# Patient Record
Sex: Female | Born: 1935 | Race: White | Hispanic: No | Marital: Married | Smoking: Never smoker
Health system: Southern US, Community
[De-identification: ages and names within clinical notes are randomized; demographics above are authoritative.]

## PROBLEM LIST (undated history)

## (undated) DIAGNOSIS — I119 Hypertensive heart disease without heart failure: Secondary | ICD-10-CM

## (undated) DIAGNOSIS — R011 Cardiac murmur, unspecified: Secondary | ICD-10-CM

## (undated) DIAGNOSIS — N183 Chronic kidney disease, stage 3 unspecified: Secondary | ICD-10-CM

## (undated) DIAGNOSIS — S82009A Unspecified fracture of unspecified patella, initial encounter for closed fracture: Secondary | ICD-10-CM

## (undated) DIAGNOSIS — E785 Hyperlipidemia, unspecified: Secondary | ICD-10-CM

## (undated) DIAGNOSIS — I10 Essential (primary) hypertension: Secondary | ICD-10-CM

## (undated) DIAGNOSIS — I517 Cardiomegaly: Secondary | ICD-10-CM

## (undated) HISTORY — PX: CATARACT EXTRACTION: SUR2

## (undated) HISTORY — DX: Hypertensive heart disease without heart failure: I11.9

## (undated) HISTORY — DX: Hyperlipidemia, unspecified: E78.5

## (undated) HISTORY — DX: Cardiac murmur, unspecified: R01.1

## (undated) HISTORY — DX: Unspecified fracture of unspecified patella, initial encounter for closed fracture: S82.009A

## (undated) HISTORY — DX: Chronic kidney disease, stage 3 (moderate): N18.3

## (undated) HISTORY — DX: Essential (primary) hypertension: I10

## (undated) HISTORY — DX: Chronic kidney disease, stage 3 unspecified: N18.30

## (undated) HISTORY — DX: Cardiomegaly: I51.7

---

## 2001-01-13 ENCOUNTER — Inpatient Hospital Stay (HOSPITAL_COMMUNITY): Admission: EM | Admit: 2001-01-13 | Discharge: 2001-01-17 | Payer: Self-pay | Admitting: Emergency Medicine

## 2001-01-13 ENCOUNTER — Encounter: Payer: Self-pay | Admitting: Emergency Medicine

## 2001-01-14 ENCOUNTER — Encounter: Payer: Self-pay | Admitting: Specialist

## 2001-01-17 ENCOUNTER — Inpatient Hospital Stay (HOSPITAL_COMMUNITY)
Admission: RE | Admit: 2001-01-17 | Discharge: 2001-01-23 | Payer: Self-pay | Admitting: Physical Medicine & Rehabilitation

## 2009-03-26 ENCOUNTER — Emergency Department (HOSPITAL_COMMUNITY): Admission: EM | Admit: 2009-03-26 | Discharge: 2009-03-26 | Payer: Self-pay | Admitting: Emergency Medicine

## 2010-09-16 NOTE — Op Note (Signed)
[place]  Patient:    Regina Hunter, Regina Hunter Visit Number: [id number] MRN: [id number]          Service Type: SUR Location: 5000 [id number] Attending Physician:  Pierce Crane Dictated by:   Javier Docker, M.D. Proc. Date: 01/13/01 Admit Date:  01/13/2001                             Operative Report  PREOPERATIVE DIAGNOSIS:  Patella fracture left, hemarthrosis.  POSTOPERATIVE DIAGNOSIS:  Patella fracture left, hemarthrosis, quadriceps tendon tear.  PROCEDURE:  ORIF of the patella, quadriceps tendon repair.  Evacuation of hematoma.  INDICATION:  This is a 77 year old who fell and displaced patella fracture. Operative intervention was indicated for ORIF.  Risks and benefits were discussed including bleeding, infection, damage to vascular structures, ______ care, arthrofibrosis, etc.  He understood and agreed to planned procedure.  DESCRIPTION OF PROCEDURE:  After administration of general anesthesia 1 gram of Kefzol the right lower extremity was prepped and draped in the usual sterile fashion.  A midline incision was made over the patella.  Hematoma evacuated.  Subcutaneous tissue was dissected.  Electrocautery was utilized to achieve hemostasis.  A transverse fracture through the patella was noted with the inferior half bisected with a T-type fracture.  Three major components were noted.  The patella was everted.  Hematoma evacuated.  Joint was irrigated with some chondromalacia of the femoral sulcus noted.  There was a tendon tear in the quadriceps tendon attaching to the medial lateral retinaculum.  The fracture pins were curetted, irrigated, and repaired with open reduction, internal fixation utilizing cannulated screws and a tension band wire.  First each inferior pole fracture of patellar fragment was then reduced to the cephalad fragment, held with a tenaculum, excellent compression.  Cannulated screw partially threaded was  utilized, measured, excellent purchase obtained.  The second fragment was key-holed in and affixed to the cephalad fragment. Both of the inferior ones interdigitated quite well.  Excellent purchase was obtained.  Decompression of the fracture hematoma was noted.  There was some slight comminution of the patella noted.  Small fragments were excised.  With C-arm augmentation the reduction was excellent.  The cannulated screws were parallel.  Next, a 22 gauge wire was then threaded through the cannulated screws beneath the patellar tendon through the quadriceps tendon and then tightened utilizing needle drivers.  Excellent purchase was noted of the patella and the knee was ranged from 0 to 60 with no displacement of the fracture fragments noted moving in continuity.  The wound was copiously irrigated.  Next, the quadriceps tendon near the area of the retinaculum was repaired with 1 and 2-0 Vicryl simple sutures with excellent repair noted.  The peritenon was reapproximated with 2-0 Vicryl simple sutures.  There was some slight loss of the periosteum over the anterior aspect of the patella.  In the subcutaneous tissue was noted to be a fairly large pocket due to the hematoma.  This area was irrigated.  It appeared viable however.  The subcutaneous tissue was reapproximated with 2-0 Vicryl simple sutures.  Skin was reapproximated with staples and the wound was dressed sterilely and secured with an Ace bandage.  The patient was then awakened without difficulty and transported to recovery room in satisfactory condition.  The patient tolerated the procedure well without complications.  -1 Dictated by:   Javier Docker, M.D. Attending Physician:  Pierce Crane DD:  01/14/01 TD:  01/14/01 Job: [id number]

## 2010-09-16 NOTE — Discharge Summary (Signed)
Regina Hunter. [facility]  Patient:    Regina Hunter, Regina Hunter Visit Number: [id number] MRN: [id number]          Service Type: [facility] Location: 4100 [id number] Attending Physician:  Regina Hunter T Dictated by:   Regina Situ, PA Admit Date:  01/17/2001 Discharge Date: 01/23/2001   CC:         Regina Hunter, M.D.             Regina Hunter, M.D.                           Discharge Summary  DISCHARGE DIAGNOSES: 1. Status post open reduction and internal fixation right patella fracture. 2. Postoperative anemia. 3. Postoperative muscle spasm.  HISTORY OF PRESENT ILLNESS:  Regina Hunter is a 77 year old female in good health who fell at church on January 13, 2001, sustained right displaced patella fracture with effusion.  She was evaluated by Regina Hunter, M.D., and underwent ORIF of the right patella with _________ of tendinous repair and evacuation of hematoma on January 14, 2001.  Postop, is partial weightbearing on right lower extremity with knee immobilizer on right lower extremity at all times to prevent range of motion.  She was started on subcu Lovenox on January 16, 2001, for DVT prophylaxis.  Postop, UA was checked and was negative with urine culture pending.  She has been participating in therapies.  Min to mod assist for transfers, min assist ambulating 60 feet with standard walker.  PAST MEDICAL HISTORY:  Negative for surgeries or major medical illness.  ALLERGIES:  No known drug allergies.  SOCIAL HISTORY:  [place] home here that is one level with three steps at entry.  She was independent prior to admission. She travels weekly to assist her 50 year old mother here. She does not use any tobacco or alcohol.  HOSPITAL COURSE:  Ms. Regina Hunter was admitted to rehab on January 17, 2001, for inpatient therapies to consist of PT and OT daily. Past admission, she  was maintained on partial weightbearing status of right lower extremity with knee immobilizer to prevent range of motion.  She was maintained on subcu Lovenox of DVT prophylaxis.  Bilateral lower extremity Duplex were done past admission showing no signs of DVT.  Labs past admission showed H&H at 10.4 and 20.5.  Electrolytes showed sodium 140, potassium 3.7 and chloride 104, CO2 28, BUN 10, creatinine 0.7, glucose 117, total protein 5.8, albumin 2.5, AST 24, ALT 27, alkaline phosphatase 71. Her knee incision was noted to be healing well without any signs or symptoms of infection or drainage.  No erythema noted.  Staples were discontinued and area steri-stripped on postop day #9 prior to discharge.  Pain control has been reasonable with Skelaxin being used p.r.n. for lower extremity spasms. The patient progressed along well and during her therapies she progressed to being at modified independent for ADL needs.  She was modified independent for transfers, modified independent for ambulating 100 to 170 feet.  Appropriate follow-up therapies to include home health PT and OT to advanced per [facility] past discharge.  On January 23, 2001, the patient was discharged to home.  DISCHARGE MEDICATIONS: 1. Vitamin C 500 mg per day. 2. Trinsicon one p.o. b.i.d. x 1 month. 3. Oxycodone 5 to 10 mg p.o. q.4-6h. p.r.n. pain. 4. Skelaxin 400 mg q.i.d. p.r.n. spasms. 5. Enteric coated aspirin 325  mg b.i.d. for two weeks.  ACTIVITY:  Use walker, wear immobilizer on right knee at all times.  Do not bend right knee.  SPECIAL INSTRUCTIONS:  No alcohol, no smoking, no driving.  FOLLOW-UP:  The patient is to follow up with Dr. Shelle Iron in seven to 10 days for postop check, follow up with Regina Hunter, M.D., as needed. Dictated by: Regina Situ, PA Attending Physician:  Regina Hunter T DD:  03/05/01 TD:  03/07/01 Job: [id number]

## 2010-09-16 NOTE — Discharge Summary (Signed)
[place]  Patient:    Regina Hunter, Regina Hunter Visit Number: [id number] MRN: [id number]          Service Type: [facility] Location: 4100 [id number] Attending Physician:  Faith Rogue T Dictated by:   Ralene Bathe, P.A.C. Admit Date:  01/17/2001 Discharge Date: 01/23/2001                             Discharge Summary  ADMISSION DIAGNOSES: 1. Patella fracture with displacement, right. 2. Hypokalemia.  DISCHARGE DIAGNOSES: 1. Patella fracture with displacement, right. 2. Hypokalemia. 3. Status post open reduction and internal fixation of right patella.  OPERATIONS:  Open reduction and internal fixation right patella.  SURGEON:  Javier Docker, M.D.  ASSISTANTJill Side P. Mahar, P.A.C.  ANESTHESIA:  General anesthesia.  BRIEF HISTORY:  This is a 77 year old female who fell the morning of admission at church, onto her right knee.  She sustained a displaced right patella fracture. She was felt to be stable to proceed with operative fixation the following day.  She was noted to be hypokalemic on admission which was supplemented.  HOSPITAL COURSE:  The patient was admitted and underwent the above named procedure and tolerated this well.  All appropriate IV antibiotics and analgesics were utilized.  Postoperatively, she was placed in a knee immobilizer.  Her urine was checked for culture and sensitivity which was negative.  She was placed on aspirin b.i.d. for DVT and PE prophylaxis.  She was allowed to partial weightbear to the operative extremity.  She was felt to require rehab consult for possible inpatient stay due to her size and age and to improve mobility.  On date, January 17, 2001, her UA was negative.  She was improving slowly.  She was neurovascularly intact.  Dressing was clean and dry.  Incision dry.  She was felt stable for transfer to rehab as they felt the patient was appropriate for rehab level therapies.  LABORATORY DATA:   Hemoglobin on admission 14.3, postoperatively was 11.8. Chemistry on admission showed potassium 3.2, postoperatively was 4.5. Protimes normal.  Urinalysis normal.  EKG showed normal sinus rhythm.  X-rays showed a patella fracture displaced and knee effusion on admission on January 13, 2001.  C-arm showed fixation.  CONDITION ON DISCHARGE:  Stable and improved.  DISCHARGE MEDICATIONS AND PLANS:  The patient being discharged to rehabilitation unit.  We will continue to follow her there. Daily dressing changes.  Continue up ad lib with therapies.  DIET:  Regular.  DISCHARGE MEDICATIONS:  Continue current medications.  ACTIVITY:  She is partial weightbearing 50% on upper extremity. Dictated by:   Ralene Bathe, P.A.C. Attending Physician:  Faith Rogue T DD:  02/19/99 TD:  02/19/01 Job: [id number]

## 2011-07-25 ENCOUNTER — Telehealth: Payer: Self-pay | Admitting: Cardiology

## 2011-07-25 NOTE — Telephone Encounter (Signed)
Pt had surgery on her eye and was told her b/p is dangerously high and needs to be checked and she was wondering if Dr. Patty Sermons will see her if not will he refer her to someone

## 2011-07-25 NOTE — Telephone Encounter (Signed)
Okay to take her as a new patient.  Schedule at the next available slot

## 2011-07-25 NOTE — Telephone Encounter (Signed)
This is Regina Hunter's wife.  Will forward to  Dr. Patty Sermons to see if he will take her as a new patient

## 2011-07-27 NOTE — Telephone Encounter (Signed)
Offered patient an appointment today but she is unable to come.  Did put her in for next week.  Patient does not monitor at home, but can.  Advised to monitor and if remains elevated to go to Urgent Care to be evaluated.  Patient states she does not have a PCP, blood pressure elevated after a procedure

## 2011-07-28 ENCOUNTER — Ambulatory Visit (INDEPENDENT_AMBULATORY_CARE_PROVIDER_SITE_OTHER): Payer: Medicare Other | Admitting: Family Medicine

## 2011-07-28 VITALS — BP 235/110 | HR 94 | Temp 97.8°F | Resp 16 | Ht 61.5 in | Wt 173.2 lb

## 2011-07-28 DIAGNOSIS — I10 Essential (primary) hypertension: Secondary | ICD-10-CM

## 2011-07-28 LAB — POCT CBC
Granulocyte percent: 73.3 %G (ref 37–80)
HCT, POC: 44.4 % (ref 37.7–47.9)
Hemoglobin: 14.4 g/dL (ref 12.2–16.2)
Lymph, poc: 2.3 (ref 0.6–3.4)
MCH, POC: 28.8 pg (ref 27–31.2)
MCHC: 32.4 g/dL (ref 31.8–35.4)
MCV: 88.8 fL (ref 80–97)
MID (cbc): 0.4 (ref 0–0.9)
MPV: 9.2 fL (ref 0–99.8)
POC Granulocyte: 7.3 — AB (ref 2–6.9)
POC LYMPH PERCENT: 22.8 %L (ref 10–50)
POC MID %: 3.9 %M (ref 0–12)
Platelet Count, POC: 294 10*3/uL (ref 142–424)
RBC: 5 M/uL (ref 4.04–5.48)
RDW, POC: 15 %
WBC: 10 10*3/uL (ref 4.6–10.2)

## 2011-07-28 LAB — COMPREHENSIVE METABOLIC PANEL
ALT: 14 U/L (ref 0–35)
AST: 18 U/L (ref 0–37)
Albumin: 4.8 g/dL (ref 3.5–5.2)
Alkaline Phosphatase: 77 U/L (ref 39–117)
BUN: 23 mg/dL (ref 6–23)
CO2: 27 mEq/L (ref 19–32)
Calcium: 9.8 mg/dL (ref 8.4–10.5)
Chloride: 102 mEq/L (ref 96–112)
Creat: 0.89 mg/dL (ref 0.50–1.10)
Glucose, Bld: 109 mg/dL — ABNORMAL HIGH (ref 70–99)
Potassium: 4.3 mEq/L (ref 3.5–5.3)
Sodium: 141 mEq/L (ref 135–145)
Total Bilirubin: 0.7 mg/dL (ref 0.3–1.2)
Total Protein: 7.4 g/dL (ref 6.0–8.3)

## 2011-07-28 LAB — LIPID PANEL
Cholesterol: 248 mg/dL — ABNORMAL HIGH (ref 0–200)
HDL: 80 mg/dL (ref >39)
LDL Cholesterol: 149 mg/dL — ABNORMAL HIGH (ref 0–99)
Total CHOL/HDL Ratio: 3.1 Ratio
Triglycerides: 96 mg/dL (ref <150)
VLDL: 19 mg/dL (ref 0–40)

## 2011-07-28 MED ORDER — METOPROLOL SUCCINATE ER 25 MG PO TB24: 25.0000 mg | ORAL_TABLET | Freq: Every day | ORAL | Status: DC

## 2011-07-28 MED ORDER — LISINOPRIL 10 MG PO TABS: 10.0000 mg | ORAL_TABLET | Freq: Every day | ORAL | Status: DC

## 2011-07-28 NOTE — Progress Notes (Signed)
  Patient Name: Regina Hunter Date of Birth: [birth year] Medical Record Number: [id number] Gender: female Date of Encounter: 07/28/2011  History of Present Illness:  Regina Hunter is a 78 y.o. very pleasant female patient who presents with the following:  Had her left cataract done last week- her eye doctor noted that her BP was 206/112.  Earlier this week she had her right eye done and BP was 196/?  Her eye doctor told her that she needed to have this evaluated right away.  She "feels fine, have been fine."  No HA, never been on BP medication.  She has been told that she had HTN in the past, BP has been systolic 190 "for the last couple of years." Her cardiologist- to- be is Dr. Patty Sermons. She will see him next week actually as a NEW patient- when she says she has seen him, it is as partner to her husband.   She has no PCP and has not had a recent PE.     There is no problem list on file for this patient.  No past medical history on file. No past surgical history on file. History  Substance Use Topics  . Smoking status: Never Smoker   . Smokeless tobacco: Not on file  . Alcohol Use: Not on file   No family history on file. No Known Allergies  Medication list has been reviewed and updated.  Review of Systems: As per HPI- otherwise negative.  Physical Examination: Filed Vitals:   07/28/11 0932  BP: 235/110  Pulse: 94  Temp: 97.8 F (36.6 C)  TempSrc: Oral  Resp: 16  Height: 5' 1.5" (1.562 m)  Weight: 173 lb 3.2 oz (78.563 kg)    Body mass index is 32.20 kg/(m^2).  GEN: WDWN, NAD, Non-toxic, A & O x 3, overweight but looks well HEENT: Atraumatic, Normocephalic. Neck supple. No masses, No LAD. Ears and Nose: No external deformity. CV: RRR, No M/G/R. No JVD. No thrill. No extra heart sounds. PULM: CTA B, no wheezes, crackles, rhonchi. No retractions. No resp. distress. No accessory muscle use. ABD: S, NT, ND, +BS. No rebound. No HSM. EXTR: No c/c/e NEURO Normal  gait.  PSYCH: Normally interactive. Conversant. Not depressed or anxious appearing.  Calm demeanor.   EKG: see scanned EKG- consistent with severe LVH.  Also called and D/w MD on call for [place] and he agreed  Assessment and Plan: 1. Hypertension, uncontrolled  POCT CBC, Comprehensive metabolic panel, Lipid panel, EKG 12-Lead, lisinopril (PRINIVIL,ZESTRIL) 10 MG tablet, metoprolol succinate (TOPROL-XL) 25 MG 24 hr tablet   Start medications today for severe uncontrolled HTN.  She has follow- up with cardiology next week.  She will continue to check her BP at home and will seek care if any CP, SOB, etc occur.

## 2011-07-31 ENCOUNTER — Encounter: Payer: Self-pay | Admitting: Family Medicine

## 2011-08-02 ENCOUNTER — Ambulatory Visit (INDEPENDENT_AMBULATORY_CARE_PROVIDER_SITE_OTHER): Payer: Medicare Other | Admitting: Cardiology

## 2011-08-02 ENCOUNTER — Ambulatory Visit (INDEPENDENT_AMBULATORY_CARE_PROVIDER_SITE_OTHER)
Admission: RE | Admit: 2011-08-02 | Discharge: 2011-08-02 | Disposition: A | Discharge status: Home / Self Care | Payer: Medicare Other | Source: Ambulatory Visit | Attending: Cardiology | Admitting: Cardiology

## 2011-08-02 ENCOUNTER — Encounter: Payer: Self-pay | Admitting: Cardiology

## 2011-08-02 ENCOUNTER — Telehealth: Payer: Self-pay | Admitting: *Deleted

## 2011-08-02 VITALS — BP 180/108 | HR 80 | Ht 61.0 in | Wt 173.0 lb

## 2011-08-02 DIAGNOSIS — R9431 Abnormal electrocardiogram [ECG] [EKG]: Secondary | ICD-10-CM | POA: Insufficient documentation

## 2011-08-02 DIAGNOSIS — I119 Hypertensive heart disease without heart failure: Secondary | ICD-10-CM

## 2011-08-02 LAB — BASIC METABOLIC PANEL
BUN: 18 mg/dL (ref 6–23)
Chloride: 103 mEq/L (ref 96–112)
Creatinine, Ser: 0.8 mg/dL (ref 0.4–1.2)
GFR: 73.98 mL/min (ref >60.00)
Glucose, Bld: 97 mg/dL (ref 70–99)

## 2011-08-02 LAB — HEPATIC FUNCTION PANEL
ALT: 17 U/L (ref 0–35)
Alkaline Phosphatase: 67 U/L (ref 39–117)
Bilirubin, Direct: 0.1 mg/dL (ref 0.0–0.3)
Total Bilirubin: 0.7 mg/dL (ref 0.3–1.2)
Total Protein: 7.3 g/dL (ref 6.0–8.3)

## 2011-08-02 LAB — LDL CHOLESTEROL, DIRECT: Direct LDL: 140.7 mg/dL

## 2011-08-02 NOTE — Progress Notes (Signed)
Patient ID: Regina Hunter, female   DOB: [birth year], 78 y.o.   MRN: [id number]

## 2011-08-02 NOTE — Patient Instructions (Addendum)
Decrease your caffeine intake  Will obtain labs today and call you with the results (LP/BMET/HFP)  Will have you go for chest  Xray today and call you with the result  Increase your Lisinopril to 20 mg daily  Increase your metoprolol to 50 mg daily  Your physician has requested that you have an echocardiogram. Echocardiography is a painless test that uses sound waves to create images of your heart. It provides your doctor with information about the size and shape of your heart and how well your heart's chambers and valves are working. This procedure takes approximately one hour. There are no restrictions for this procedure. IN ABOUT 2 WEEKS  Your physician recommends that you schedule a follow-up appointment in: 3 weeks with Lawson Fiscal or  Dr. Patty Sermons

## 2011-08-02 NOTE — Progress Notes (Signed)
Regina Hunter Date of Birth:  01/12/1927 [facility] [address] [place], [place]  [address] [phone]         Fax   [phone]  History of Present Illness: This pleasant 78 year old woman is seen for the first time today as a patient.  We have seen her in the past as the wife of one of our long-term patients.  Mrs. comes in today for evaluation and treatment of recently discovered high blood pressure.  Up until last week the patient was on no regular medication.  She had recent cataract surgery and on each occasion her blood pressure was significantly elevated.  At visit it was as high as 206/112.  In the past she would sometimes check her blood pressure at [facility] and would often read in the range of 190 systolic which she attributed to the machine not being accurate.  Despite her very high blood pressure the patient denies any symptoms referable to her high blood pressure.  Specifically no headaches or dizzy spells.  No symptoms of CHF.  She was seen by Dr. Patsy Lager on 07/28/11 at which time her blood pressure was 235/110 and at that time she was begun on lisinopril 10 mg daily and metoprolol 25 mg daily.  She has had no side effects from the medication.  Family history reveals that her mother had a history of high blood pressure and died of congestive heart failure at age 16                                            Her father died in his 81s of complications from stomach ulcer surgery                                            She has a history of a sister who has been on blood pressure medication since age 82.  Social history reveals that she is married.  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  She has never smoked.  He rarely drinks any wine.  She has not been hospitalized except for childbirth 57 years ago. She has no known drug allergies. Current Outpatient Prescriptions  Medication Sig Dispense Refill  . lisinopril  (PRINIVIL,ZESTRIL) 10 MG tablet Take 10 mg by mouth. Take two tablets daily      . metoprolol succinate (TOPROL-XL) 25 MG 24 hr tablet Take 25 mg by mouth. Take two tablets daily      . DISCONTD: lisinopril (PRINIVIL,ZESTRIL) 10 MG tablet Take 1 tablet (10 mg total) by mouth daily.  60 tablet  3  . DISCONTD: metoprolol succinate (TOPROL-XL) 25 MG 24 hr tablet Take 1 tablet (25 mg total) by mouth daily.  60 tablet  3    No Known Allergies  Patient Active Problem List  Diagnoses  . Benign hypertensive heart disease without heart failure  . Abnormal EKG    History  Smoking status  . Never Smoker   Smokeless tobacco  . Not on file    History  Alcohol Use: Not on file    No family history on file.  Review of Systems: Constitutional: no fever chills diaphoresis or fatigue or change in weight.  Head and neck: no hearing loss, no epistaxis, no  photophobia or visual disturbance. Respiratory: No cough, shortness of breath or wheezing. Cardiovascular: No chest pain peripheral edema, palpitations. Gastrointestinal: No abdominal distention, no abdominal pain, no change in bowel habits hematochezia or melena. Genitourinary: No dysuria, no frequency, no urgency, no nocturia. Musculoskeletal:No arthralgias, no back pain, no gait disturbance or myalgias. Neurological: No dizziness, no headaches, no numbness, no seizures, no syncope, no weakness, no tremors. Hematologic: No lymphadenopathy, no easy bruising. Psychiatric: No confusion, no hallucinations, no sleep disturbance.    Physical Exam: Filed Vitals:   08/02/11 1030  BP: 180/108  Pulse: 80   the general appearance reveals a well-developed well-nourished elderly woman in no distress.Pupils equal and reactive.   Extraocular Movements are full.  There is no scleral icterus.  The mouth and pharynx are normal.  The neck is supple.  The carotids reveal no bruits.  The jugular venous pressure is normal.  The thyroid is not enlarged.  There  is no lymphadenopathy.  The chest is clear to percussion and auscultation. There are no rales or rhonchi. Expansion of the chest is symmetrical.  Heart reveals a soft systolic ejection murmur at the base.  No gallop or rub. The abdomen is soft and nontender. Bowel sounds are normal. The liver and spleen are not enlarged. There Are no abdominal masses. There are no bruits.  The pedal pulses are good.  There is no phlebitis or edema.  There is no cyanosis or clubbing. Strength is normal and symmetrical in all extremities.  There is no lateralizing weakness.  There are no sensory deficits.  The skin is warm and dry.  There is no rash.  EKG today shows normal sinus rhythm and LVH with strain, not significantly changed since 07/28/11   Assessment / Plan: The patient appears to have severe essential hypertension.  Today we are obtaining additional lab work including lipid panel, hepatic function panel, and basal metabolic panel.  We will also obtain a chest x-ray to look at heart size.  To evaluate her basilar systolic murmur we will obtain an echocardiogram.  For additional further control of her blood pressure we will increase her lisinopril to 20 mg daily and increase her metoprolol succinate 50 mg daily.  She will return in about 3 weeks for followup office visit.  If blood pressure remains high we may need to consider adding a low-dose diuretic or amlodipine.  The patient is already practicing a low-salt diet at home.  She does have about 4 servings of caffeine daily and she should cut back on that.

## 2011-08-02 NOTE — Telephone Encounter (Signed)
Advised patient

## 2011-08-02 NOTE — Telephone Encounter (Signed)
Message copied by Burnell Blanks on Wed Aug 02, 2011  3:55 PM ------      Message from: Cassell Clement      Created: Wed Aug 02, 2011  2:18 PM       Please report.  The chest xray shows mild cardiomegaly but no CHF. Lungs are clear.

## 2011-08-08 ENCOUNTER — Telehealth: Payer: Self-pay | Admitting: *Deleted

## 2011-08-08 NOTE — Telephone Encounter (Signed)
Message copied by Burnell Blanks on Tue Aug 08, 2011  9:22 AM ------      Message from: Cassell Clement      Created: Thu Aug 03, 2011 11:40 AM       Blood tests are fine except for elevated cholesterol and LDL cholesterol.  Work harder on diet at this point and we can discuss statin therapy at her next visit.

## 2011-08-08 NOTE — Telephone Encounter (Signed)
Advised of labs 

## 2011-08-16 ENCOUNTER — Other Ambulatory Visit: Payer: Self-pay

## 2011-08-16 ENCOUNTER — Ambulatory Visit (HOSPITAL_COMMUNITY): Payer: Medicare Other | Attending: Cardiology

## 2011-08-16 DIAGNOSIS — I119 Hypertensive heart disease without heart failure: Secondary | ICD-10-CM

## 2011-08-16 DIAGNOSIS — I10 Essential (primary) hypertension: Secondary | ICD-10-CM | POA: Insufficient documentation

## 2011-08-16 DIAGNOSIS — R9431 Abnormal electrocardiogram [ECG] [EKG]: Secondary | ICD-10-CM

## 2011-08-16 DIAGNOSIS — I517 Cardiomegaly: Secondary | ICD-10-CM | POA: Insufficient documentation

## 2011-08-16 DIAGNOSIS — I059 Rheumatic mitral valve disease, unspecified: Secondary | ICD-10-CM | POA: Insufficient documentation

## 2011-08-17 ENCOUNTER — Telehealth: Payer: Self-pay | Admitting: *Deleted

## 2011-08-17 NOTE — Telephone Encounter (Signed)
Message copied by Burnell Blanks on Thu Aug 17, 2011  5:30 PM ------      Message from: Cassell Clement      Created: Wed Aug 16, 2011  4:01 PM       Please report.  Echo is okay.  Good LV function. The aortic valve shows only mild AS. Continue current Rx

## 2011-08-17 NOTE — Telephone Encounter (Signed)
Advised of echo  

## 2011-08-24 ENCOUNTER — Ambulatory Visit (INDEPENDENT_AMBULATORY_CARE_PROVIDER_SITE_OTHER): Payer: Medicare Other | Admitting: Nurse Practitioner

## 2011-08-24 ENCOUNTER — Encounter: Payer: Self-pay | Admitting: Nurse Practitioner

## 2011-08-24 VITALS — BP 162/90 | HR 62 | Ht 61.5 in | Wt 172.0 lb

## 2011-08-24 DIAGNOSIS — I119 Hypertensive heart disease without heart failure: Secondary | ICD-10-CM

## 2011-08-24 MED ORDER — HYDROCHLOROTHIAZIDE 25 MG PO TABS: 25.0000 mg | ORAL_TABLET | Freq: Every day | ORAL | Status: DC

## 2011-08-24 MED ORDER — METOPROLOL SUCCINATE ER 50 MG PO TB24: 50.0000 mg | ORAL_TABLET | Freq: Every day | ORAL | Status: DC

## 2011-08-24 MED ORDER — LISINOPRIL 20 MG PO TABS: 20.0000 mg | ORAL_TABLET | Freq: Every day | ORAL | Status: DC

## 2011-08-24 NOTE — Assessment & Plan Note (Signed)
Blood pressure remains elevated. Recheck by me was 170/100 in the right arm and 160/100 in the left. I am adding HCTZ 25 mg to her regimen daily. I will see her back in 2 weeks with a repeat BMET. She is to continue to monitor at home. Patient is agreeable to this plan and will call if any problems develop in the interim.

## 2011-08-24 NOTE — Patient Instructions (Signed)
We are going to add HCTZ 25 mg each day.  I have refilled your other medicines for the Toprol 50 mg and the Lisinopril 20 mg - each just once a day  I will see you in 2 weeks with repeat labs  Call the [facility] office at [phone] if you have any questions, problems or concerns.

## 2011-08-24 NOTE — Progress Notes (Signed)
   Regina Hunter Date of Birth: 07/22/1926 Medical Record #[id number]  History of Present Illness: Regina Hunter is seen back today for a 3 week check. She is seen for Dr. Patty Sermons. She has marked HTN. Now on ACE and beta blocker. Has no other health issues. Did have some LVH on her echo but with a normal EF. CXR showed cardiomegaly.   She comes in today. She is here alone. Doing ok. Some fatigue with the beta blocker but thinks she can tolerate. Does have some occasional puffiness of her ankles. Tries not to use much salt. No chest pain. Not short of breath. Blood pressure remains high. She has no headaches or blurred vision.   Current Outpatient Prescriptions on File Prior to Visit  Medication Sig Dispense Refill  . lisinopril (PRINIVIL,ZESTRIL) 10 MG tablet Take 20 mg by mouth daily. Take two tablets daily      . metoprolol succinate (TOPROL-XL) 25 MG 24 hr tablet Take 50 mg by mouth daily. Take two tablets daily        No Known Allergies  Past Medical History  Diagnosis Date  . Cataract   . HTN (hypertension)   . Murmur     Past Surgical History  Procedure Date  . Cataract extraction     History  Smoking status  . Never Smoker   Smokeless tobacco  . Not on file    History  Alcohol Use No    History reviewed. No pertinent family history.  Review of Systems: The review of systems is per the HPI.  All other systems were reviewed and are negative.  Physical Exam: BP 162/90  Pulse 62  Ht 5' 1.5" (1.562 m)  Wt 172 lb (78.019 kg)  BMI 31.97 kg/m2 Patient is very pleasant and in no acute distress. She is obese.  Skin is warm and dry. Color is normal.  HEENT is unremarkable. Normocephalic/atraumatic. PERRL. Sclera are nonicteric. Neck is supple. No masses. No JVD. Lungs are clear. Cardiac exam shows a regular rate and rhythm. Soft outflow murmur. Abdomen is soft. Extremities are without edema. Gait and ROM are intact. No gross neurologic deficits noted.   LABORATORY  DATA: Lab Results  Component Value Date   WBC 10.0 07/28/2011   HGB 14.4 07/28/2011   HCT 44.4 07/28/2011   GLUCOSE 97 08/02/2011   CHOL 233* 08/02/2011   TRIG 89.0 08/02/2011   HDL 68.20 08/02/2011   LDLDIRECT 140.7 08/02/2011   LDLCALC 149* 07/28/2011   ALT 17 08/02/2011   AST 19 08/02/2011   NA 142 08/02/2011   K 4.0 08/02/2011   CL 103 08/02/2011   CREATININE 0.8 08/02/2011   BUN 18 08/02/2011   CO2 28 08/02/2011   Echo Study Conclusions  - Left ventricle: The cavity size was normal. Wall thickness was increased in a pattern of mild LVH. Systolic function was normal. The estimated ejection fraction was in the range of 55% to 60%. - Aortic valve: There was mild stenosis. Mean gradient: 15mm Hg (S). Peak gradient: 24mm Hg (S). - Mitral valve: Mild regurgitation. - Left atrium: The atrium was mildly dilated. - Atrial septum: No defect or patent foramen ovale was identified. - Pulmonary arteries: PA peak pressure: 45mm Hg (S). Transthoracic echocardiography. M-mode, complete 2D,   Assessment / Plan:

## 2011-09-07 ENCOUNTER — Encounter: Payer: Self-pay | Admitting: Nurse Practitioner

## 2011-09-07 ENCOUNTER — Ambulatory Visit (INDEPENDENT_AMBULATORY_CARE_PROVIDER_SITE_OTHER): Payer: Medicare Other | Admitting: Nurse Practitioner

## 2011-09-07 VITALS — BP 164/90 | HR 64 | Ht 61.5 in | Wt 168.0 lb

## 2011-09-07 DIAGNOSIS — I10 Essential (primary) hypertension: Secondary | ICD-10-CM

## 2011-09-07 DIAGNOSIS — I119 Hypertensive heart disease without heart failure: Secondary | ICD-10-CM

## 2011-09-07 LAB — BASIC METABOLIC PANEL
BUN: 23 mg/dL (ref 6–23)
CO2: 29 mEq/L (ref 19–32)
Calcium: 9.5 mg/dL (ref 8.4–10.5)
Chloride: 102 mEq/L (ref 96–112)
Creatinine, Ser: 1.1 mg/dL (ref 0.4–1.2)
GFR: 52.12 mL/min — ABNORMAL LOW (ref >60.00)
Glucose, Bld: 99 mg/dL (ref 70–99)
Potassium: 4.6 mEq/L (ref 3.5–5.1)
Sodium: 141 mEq/L (ref 135–145)

## 2011-09-07 MED ORDER — LISINOPRIL 20 MG PO TABS: 20.0000 mg | ORAL_TABLET | Freq: Two times a day (BID) | ORAL | Status: DC

## 2011-09-07 NOTE — Progress Notes (Signed)
   Regina Hunter Date of Birth: January 18, 1927 Medical Record #[id number]  History of Present Illness: Regina Hunter is seen back today for a 2 week check. She is seen for Dr. Patty Sermons. She has marked HTN and is now on therapy. She has no other health issues. Does have some LVH on her echo but has a normal EF. CXR showed cardiomegaly. We added HCTZ 2 weeks ago to her beta blocker and ACE.   She comes in today. She is here alone. She is doing well. Blood pressure is slowly coming down. She feels good on her medicines. Not dizzy or lightheaded. Fatigue has gotten better. No swelling. Has lost a few pounds. No chest pain or shortness of breath reported.   Current Outpatient Prescriptions on File Prior to Visit  Medication Sig Dispense Refill  . hydrochlorothiazide (HYDRODIURIL) 25 MG tablet Take 1 tablet (25 mg total) by mouth daily.  30 tablet  6  . metoprolol succinate (TOPROL XL) 50 MG 24 hr tablet Take 1 tablet (50 mg total) by mouth daily. Take with or immediately following a meal.  30 tablet  11  . DISCONTD: lisinopril (PRINIVIL,ZESTRIL) 20 MG tablet Take 1 tablet (20 mg total) by mouth daily.  30 tablet  11    No Known Allergies  Past Medical History  Diagnosis Date  . Cataract   . HTN (hypertension)   . Murmur   . Fractured patella     s/p repair by Dr. Shelle Iron  . LVH (left ventricular hypertrophy)     per echo April 2013  . Cardiomegaly     per CXR April 2013    Past Surgical History  Procedure Date  . Cataract extraction     History  Smoking status  . Never Smoker   Smokeless tobacco  . Not on file    History  Alcohol Use No    History reviewed. No pertinent family history.  Review of Systems: The review of systems is per the HPI.  All other systems were reviewed and are negative.  Physical Exam: BP 164/90  Pulse 64  Ht 5' 1.5" (1.562 m)  Wt 168 lb (76.204 kg)  BMI 31.23 kg/m2 Patient is very pleasant and in no acute distress. She is obese. Skin is warm and dry.  Color is normal.  HEENT is unremarkable. Normocephalic/atraumatic. PERRL. Sclera are nonicteric. Neck is supple. No masses. No JVD. Lungs are clear. Cardiac exam shows a regular rate and rhythm. Abdomen is soft. Extremities are without edema. Gait and ROM are intact. No gross neurologic deficits noted.   LABORATORY DATA:   Chemistry      Component Value Date/Time   NA 142 08/02/2011 1144   K 4.0 08/02/2011 1144   CL 103 08/02/2011 1144   CO2 28 08/02/2011 1144   BUN 18 08/02/2011 1144   CREATININE 0.8 08/02/2011 1144   CREATININE 0.89 07/28/2011 1129      Component Value Date/Time   CALCIUM 9.3 08/02/2011 1144   ALKPHOS 67 08/02/2011 1144   AST 19 08/02/2011 1144   ALT 17 08/02/2011 1144   BILITOT 0.7 08/02/2011 1144        Assessment / Plan:

## 2011-09-07 NOTE — Assessment & Plan Note (Signed)
Blood pressure is slowly coming down. We are rechecking a BMET today. I have increased the Lisinopril to BID. I will see her in a month. She will continue to monitor at home. Patient is agreeable to this plan and will call if any problems develop in the interim.

## 2011-09-07 NOTE — Patient Instructions (Signed)
Your blood pressure is getting better.  Stay on your current medicines except increase the Lisinopril to two times a day  I will see you in a month  Continue to monitor your blood pressure at home.  Avoid salt.  Call the [facility] office at [phone] if you have any questions, problems or concerns.

## 2011-09-11 ENCOUNTER — Telehealth: Payer: Self-pay | Admitting: *Deleted

## 2011-09-11 NOTE — Telephone Encounter (Signed)
Advised of labs 

## 2011-09-11 NOTE — Telephone Encounter (Signed)
Message copied by Burnell Blanks on Mon Sep 11, 2011 11:31 AM ------      Message from: Rosalio Macadamia      Created: Fri Sep 08, 2011  7:55 AM       Ok to report. Labs are satisfactory.

## 2011-09-11 NOTE — Telephone Encounter (Signed)
Message copied by Burnell Blanks on Mon Sep 11, 2011 11:32 AM ------      Message from: Rosalio Macadamia      Created: Fri Sep 08, 2011  7:55 AM       Ok to report. Labs are satisfactory.

## 2011-10-09 ENCOUNTER — Ambulatory Visit (INDEPENDENT_AMBULATORY_CARE_PROVIDER_SITE_OTHER): Payer: Medicare Other | Admitting: Nurse Practitioner

## 2011-10-09 ENCOUNTER — Encounter: Payer: Self-pay | Admitting: Nurse Practitioner

## 2011-10-09 VITALS — BP 170/70 | HR 74 | Ht 62.0 in | Wt 166.0 lb

## 2011-10-09 DIAGNOSIS — I119 Hypertensive heart disease without heart failure: Secondary | ICD-10-CM

## 2011-10-09 DIAGNOSIS — I10 Essential (primary) hypertension: Secondary | ICD-10-CM

## 2011-10-09 LAB — BASIC METABOLIC PANEL
BUN: 24 mg/dL — ABNORMAL HIGH (ref 6–23)
CO2: 28 mEq/L (ref 19–32)
Calcium: 9.3 mg/dL (ref 8.4–10.5)
Chloride: 103 mEq/L (ref 96–112)
Creatinine, Ser: 1.2 mg/dL (ref 0.4–1.2)
GFR: 46.54 mL/min — ABNORMAL LOW (ref >60.00)
Glucose, Bld: 100 mg/dL — ABNORMAL HIGH (ref 70–99)
Potassium: 4.1 mEq/L (ref 3.5–5.1)
Sodium: 140 mEq/L (ref 135–145)

## 2011-10-09 MED ORDER — AMLODIPINE-OLMESARTAN 5-40 MG PO TABS: 1.0000 | ORAL_TABLET | Freq: Every day | ORAL | Status: DC

## 2011-10-09 NOTE — Assessment & Plan Note (Signed)
Blood pressure is slowly trending down. Does have a dry hacky cough reported which is probably ACE related. I have stopped the Lisinopril. Have given her samples of Azor 5/40 daily to try. We will see her in a month and I have asked her to bring her cuff in to check for correlation. We will check a BMET today as well. Patient is agreeable to this plan and will call if any problems develop in the interim.

## 2011-10-09 NOTE — Patient Instructions (Signed)
Stop the Lisinopril after today  Tomorrow start Azor 5/40 daily   Continue with your other medicines  We are rechecking your potassium level today  Dr. Patty Sermons will see you in one month.  Continue to monitor your blood pressure at home. Bring your cuff when you come back and we will check it.  Call the [facility] office at [phone] if you have any questions, problems or concerns.

## 2011-10-09 NOTE — Progress Notes (Signed)
   Regina Hunter Date of Birth: 1926-10-07 Medical Record #[id number]  History of Present Illness: Regina Hunter is seen back today for a one month check. She is seen for Dr. Patty Sermons. She has HTN with LVH on her echo but with a normal EF. She is on metoprolol and HCTZ with her ACE.  She comes in today. She is feeling pretty good. Blood pressure at home is averaging in the 160 to 170's systolic. No swelling or dizziness. Energy level is pretty good. She is more worried about her husband's health.No chest pain and not short of breath. She is actively trying to lose weight. She does note a chronic dry hacky cough. No sputum.   Current Outpatient Prescriptions on File Prior to Visit  Medication Sig Dispense Refill  . hydrochlorothiazide (HYDRODIURIL) 25 MG tablet Take 1 tablet (25 mg total) by mouth daily.  30 tablet  6  . metoprolol succinate (TOPROL XL) 50 MG 24 hr tablet Take 1 tablet (50 mg total) by mouth daily. Take with or immediately following a meal.  30 tablet  11  . amLODipine-olmesartan (AZOR) 5-40 MG per tablet Take 1 tablet by mouth daily.        No Known Allergies  Past Medical History  Diagnosis Date  . Cataract   . HTN (hypertension)   . Murmur   . Fractured patella     s/p repair by Dr. Shelle Iron  . LVH (left ventricular hypertrophy)     per echo April 2013  . Cardiomegaly     per CXR April 2013    Past Surgical History  Procedure Date  . Cataract extraction     History  Smoking status  . Never Smoker   Smokeless tobacco  . Not on file    History  Alcohol Use No    History reviewed. No pertinent family history.  Review of Systems: The review of systems is per the HPI.  All other systems were reviewed and are negative.  Physical Exam: BP 170/70  Pulse 74  Ht 5\' 2"  (1.575 m)  Wt 166 lb (75.297 kg)  BMI 30.36 kg/m2 Patient is very pleasant and in no acute distress. Skin is warm and dry. Color is normal.  HEENT is unremarkable. Normocephalic/atraumatic.  PERRL. Sclera are nonicteric. Neck is supple. No masses. No JVD. Lungs are clear. Cardiac exam shows a regular rate and rhythm. She has a soft outflow murmur. Abdomen is soft. Extremities are without edema. Gait and ROM are intact. No gross neurologic deficits noted.   LABORATORY DATA: Repeat BMET is pending for today.    Assessment / Plan:

## 2011-10-10 ENCOUNTER — Telehealth: Payer: Self-pay | Admitting: *Deleted

## 2011-10-10 NOTE — Telephone Encounter (Signed)
Advised patient of lab results  

## 2011-10-10 NOTE — Telephone Encounter (Signed)
Message copied by Burnell Blanks on Tue Oct 10, 2011  2:51 PM ------      Message from: Rosalio Macadamia      Created: Mon Oct 09, 2011  4:27 PM       Ok to report. Labs are satisfactory.

## 2011-10-17 ENCOUNTER — Telehealth: Payer: Self-pay | Admitting: Cardiology

## 2011-10-17 MED ORDER — OLMESARTAN MEDOXOMIL 40 MG PO TABS: 40.0000 mg | ORAL_TABLET | Freq: Every day | ORAL | Status: DC

## 2011-10-17 NOTE — Telephone Encounter (Signed)
Please return call to patient at [phone] regarding swelling in feet, ankles, and hands since Regina Hunter. Took her off of lisinopril and started her on azor med.

## 2011-10-17 NOTE — Telephone Encounter (Signed)
Spoke with pt, her cough is better off the lisinopril. For the last two days she is having swelling in her feet and legs. She had to take her shoes off because they are getting tight. She does report her bp is better. Will discuss with lori gerhardt np.

## 2011-10-17 NOTE — Telephone Encounter (Signed)
Discussed with lori gerhardt np, pt told to stop azor. Samples of benicar 40 mg once daily placed at the front desk for pick up. She will call with further problems.

## 2011-11-10 ENCOUNTER — Ambulatory Visit (INDEPENDENT_AMBULATORY_CARE_PROVIDER_SITE_OTHER): Payer: Medicare Other | Admitting: Cardiology

## 2011-11-10 ENCOUNTER — Encounter: Payer: Self-pay | Admitting: Cardiology

## 2011-11-10 VITALS — BP 138/78 | HR 66 | Ht 61.5 in | Wt 162.0 lb

## 2011-11-10 DIAGNOSIS — I119 Hypertensive heart disease without heart failure: Secondary | ICD-10-CM

## 2011-11-10 MED ORDER — OLMESARTAN MEDOXOMIL 40 MG PO TABS: 40.0000 mg | ORAL_TABLET | Freq: Every day | ORAL | Status: DC

## 2011-11-10 NOTE — Assessment & Plan Note (Signed)
The patient denies any chest pain or shortness of breath.  No symptoms from her high blood pressure which has now been brought under control on triple therapy.  She has been under more stress because her husband is presently hospitalized in the intensive care unit with pulmonary and cardiac problems.  The patient is anticipating that her husband will have to go to a skilled nursing facility temporarily post hospital until he gets his strength back.  She works during the day and is not home to help look after him

## 2011-11-10 NOTE — Progress Notes (Signed)
Regina Hunter Date of Birth:  May 20, 1926 [facility] [address] [place], [place]  [address] [phone]  Fax   [phone]  HPI: This pleasant 78 year old woman is seen for a scheduled followup office visit.  She is seen in followup for essential hypertension.  She brought in a list of her daily blood pressures which have shown gradual progressive improvement since last visit.  She is not having any side effects from her current regimen of hydrochlorothiazide, metoprolol, and Benicar.  She denies any chest pain or shortness of breath.  She previously had developed a cough from ACE inhibitors but tolerating the ARB without difficulty.  Current Outpatient Prescriptions  Medication Sig Dispense Refill  . hydrochlorothiazide (HYDRODIURIL) 25 MG tablet Take 1 tablet (25 mg total) by mouth daily.  30 tablet  6  . metoprolol succinate (TOPROL XL) 50 MG 24 hr tablet Take 1 tablet (50 mg total) by mouth daily. Take with or immediately following a meal.  30 tablet  11  . olmesartan (BENICAR) 40 MG tablet Take 1 tablet (40 mg total) by mouth daily.  90 tablet  3  . DISCONTD: olmesartan (BENICAR) 40 MG tablet Take 1 tablet (40 mg total) by mouth daily.        No Known Allergies  Patient Active Problem List  Diagnosis  . Benign hypertensive heart disease without heart failure  . Abnormal EKG    History  Smoking status  . Never Smoker   Smokeless tobacco  . Not on file    History  Alcohol Use No    No family history on file.  Review of Systems: The patient denies any heat or cold intolerance.  No weight gain or weight loss.  The patient denies headaches or blurry vision.  There is no cough or sputum production.  The patient denies dizziness.  There is no hematuria or hematochezia.  The patient denies any muscle aches or arthritis.  The patient denies any rash.  The patient denies frequent falling or instability.  There is no history of depression or anxiety.   All other systems were reviewed and are negative.   Physical Exam: Filed Vitals:   11/10/11 0834  BP: 138/78  Pulse: 66   The general appearance reveals a well-developed well-nourished woman in no distress.The head and neck exam reveals pupils equal and reactive.  Extraocular movements are full.  There is no scleral icterus.  The mouth and pharynx are normal.  The neck is supple.  The carotids reveal no bruits.  The jugular venous pressure is normal.  The  thyroid is not enlarged.  There is no lymphadenopathy.  The chest is clear to percussion and auscultation.  There are no rales or rhonchi.  Expansion of the chest is symmetrical.  The precordium is quiet.  The first heart sound is normal.  The second heart sound is physiologically split.  There is no murmur gallop rub or click.  There is no abnormal lift or heave.  The abdomen is soft and nontender.  The bowel sounds are normal.  The liver and spleen are not enlarged.  There are no abdominal masses.  There are no abdominal bruits.  Extremities reveal good pedal pulses.  There is no phlebitis or edema.  There is no cyanosis or clubbing.  Strength is normal and symmetrical in all extremities.  There is no lateralizing weakness.  There are no sensory deficits.  The skin is warm and dry.  There is no rash.  Assessment / Plan: Continue present medication.  We gave her a new prescription for Benicar 40 mg one daily.  Recheck in 2 months for followup office visit and basal metabolic panel

## 2011-11-10 NOTE — Patient Instructions (Addendum)
Your physician recommends that you continue on your current medications as directed. Please refer to the Current Medication list given to you today.  Your physician recommends that you schedule a follow-up appointment in: 2 month ov/bmet 

## 2011-12-20 ENCOUNTER — Other Ambulatory Visit: Payer: Self-pay | Admitting: *Deleted

## 2011-12-20 MED ORDER — HYDROCHLOROTHIAZIDE 25 MG PO TABS: 25.0000 mg | ORAL_TABLET | Freq: Every day | ORAL | Status: DC

## 2011-12-20 MED ORDER — OLMESARTAN MEDOXOMIL 40 MG PO TABS: 40.0000 mg | ORAL_TABLET | Freq: Every day | ORAL | Status: DC

## 2011-12-20 MED ORDER — METOPROLOL SUCCINATE ER 50 MG PO TB24: 50.0000 mg | ORAL_TABLET | Freq: Every day | ORAL | Status: DC

## 2012-01-11 ENCOUNTER — Ambulatory Visit (INDEPENDENT_AMBULATORY_CARE_PROVIDER_SITE_OTHER): Payer: Medicare Other | Admitting: Nurse Practitioner

## 2012-01-11 ENCOUNTER — Ambulatory Visit (INDEPENDENT_AMBULATORY_CARE_PROVIDER_SITE_OTHER): Payer: Medicare Other | Admitting: *Deleted

## 2012-01-11 ENCOUNTER — Encounter: Payer: Self-pay | Admitting: Nurse Practitioner

## 2012-01-11 VITALS — BP 170/80 | HR 74 | Ht 61.0 in | Wt 162.0 lb

## 2012-01-11 DIAGNOSIS — I119 Hypertensive heart disease without heart failure: Secondary | ICD-10-CM

## 2012-01-11 DIAGNOSIS — I10 Essential (primary) hypertension: Secondary | ICD-10-CM

## 2012-01-11 LAB — BASIC METABOLIC PANEL
CO2: 28 mEq/L (ref 19–32)
Chloride: 103 mEq/L (ref 96–112)
Glucose, Bld: 99 mg/dL (ref 70–99)
Sodium: 141 mEq/L (ref 135–145)

## 2012-01-11 NOTE — Progress Notes (Signed)
   Regina Hunter Date of Birth: 26-Jul-1926 Medical Record #[id number]  History of Present Illness: Regina Hunter is seen back today for a 2 month check. She is seen for Dr. Patty Sermons. She has HTN and has just been placed on medicines over the past few months. Her blood pressure has trended down at home. She reports readings in the 135 to 145 systolic range at home. She has not had any medicines today since she was having blood work today. She is under more stress with her ailing husband. Does not get enough rest and admits that she is tired and fatigued. She is tolerating her medicines. No chest pain. Not short of breath.   Current Outpatient Prescriptions on File Prior to Visit  Medication Sig Dispense Refill  . hydrochlorothiazide (HYDRODIURIL) 25 MG tablet Take 1 tablet (25 mg total) by mouth daily.  90 tablet  2  . metoprolol succinate (TOPROL XL) 50 MG 24 hr tablet Take 1 tablet (50 mg total) by mouth daily. Take with or immediately following a meal.  90 tablet  3  . olmesartan (BENICAR) 40 MG tablet Take 1 tablet (40 mg total) by mouth daily.  90 tablet  3    No Known Allergies  Past Medical History  Diagnosis Date  . Cataract   . HTN (hypertension)   . Murmur   . Fractured patella     s/p repair by Dr. Shelle Iron  . LVH (left ventricular hypertrophy)     per echo April 2013  . Cardiomegaly     per CXR April 2013    Past Surgical History  Procedure Date  . Cataract extraction     History  Smoking status  . Never Smoker   Smokeless tobacco  . Not on file    History  Alcohol Use No    History reviewed. No pertinent family history.  Review of Systems: The review of systems is per the HPI.  All other systems were reviewed and are negative.  Physical Exam: BP 170/80  Pulse 74  Ht 5\' 1"  (1.549 m)  Wt 162 lb (73.483 kg)  BMI 30.61 kg/m2 Patient is very pleasant and in no acute distress. She is obese. Skin is warm and dry. Color is normal.  HEENT is unremarkable.  Normocephalic/atraumatic. PERRL. Sclera are nonicteric. Neck is supple. No masses. No JVD. Lungs are clear. Cardiac exam shows a regular rate and rhythm. Abdomen is soft. Extremities are without edema. Gait and ROM are intact. No gross neurologic deficits noted.  LABORATORY DATA: PENDING    Assessment / Plan:  1. HTN - she reports better BP readings at home. She has had no medicines today. Repeat BP by me was a little lower. For now, I have left her on her current regimen. She will take her medicines when she gets back home this morning and call us later today with a BP reading.   2. Situational stress - I have encouraged her to try and rest as best she can.   We will see her back in 3 months. Labs are checked today. She will continue to monitor her BP at home. Patient is agreeable to this plan and will call if any problems develop in the interim.

## 2012-01-11 NOTE — Patient Instructions (Signed)
We will check your labs today  Take your medicines when you get home this morning. This afternoon, check your blood pressure and call and let us know what it is  We will see you back in 3 months  Call the [facility] office at [phone] if you have any questions, problems or concerns.

## 2012-01-21 NOTE — Progress Notes (Signed)
Quick Note:  Please report to patient. The recent labs are stable. Continue same medication and careful diet. ______ 

## 2012-01-23 ENCOUNTER — Telehealth: Payer: Self-pay | Admitting: *Deleted

## 2012-01-23 NOTE — Telephone Encounter (Signed)
Message copied by Burnell Blanks on Tue Jan 23, 2012  9:20 AM ------      Message from: Cassell Clement      Created: Sun Jan 21, 2012  4:27 PM       Please report to patient.  The recent labs are stable. Continue same medication and careful diet.

## 2012-01-23 NOTE — Telephone Encounter (Signed)
Advised patient of lab results  

## 2012-03-13 ENCOUNTER — Ambulatory Visit (INDEPENDENT_AMBULATORY_CARE_PROVIDER_SITE_OTHER): Payer: Medicare Other | Admitting: Nurse Practitioner

## 2012-03-13 ENCOUNTER — Encounter: Payer: Self-pay | Admitting: Nurse Practitioner

## 2012-03-13 VITALS — BP 180/80 | HR 52 | Ht 61.0 in | Wt 166.6 lb

## 2012-03-13 DIAGNOSIS — I10 Essential (primary) hypertension: Secondary | ICD-10-CM

## 2012-03-13 MED ORDER — METOPROLOL SUCCINATE ER 50 MG PO TB24: 50.0000 mg | ORAL_TABLET | Freq: Every day | ORAL | Status: DC

## 2012-03-13 MED ORDER — OLMESARTAN MEDOXOMIL 40 MG PO TABS: 40.0000 mg | ORAL_TABLET | Freq: Every day | ORAL | Status: DC

## 2012-03-13 MED ORDER — HYDROCHLOROTHIAZIDE 25 MG PO TABS: 25.0000 mg | ORAL_TABLET | Freq: Every day | ORAL | Status: DC

## 2012-03-13 NOTE — Patient Instructions (Addendum)
I think you are doing well  Stay on your current medicines  Monitor your blood pressure at home  Dr. Patty Sermons will see you in 4 months. Come fasting. We will probably check labs with that visit.  Call the [facility] office at [phone] if you have any questions, problems or concerns.

## 2012-03-13 NOTE — Progress Notes (Signed)
   Regina Hunter Date of Birth: Feb 12, 1927 Medical Record #[id number]  History of Present Illness: Regina Hunter is seen back today for a follow up visit. It is a 2 month check. She is seen for Dr. Patty Sermons. She has HTN. Now on 3 medicines for BP control.   She comes back today. She is here alone. Doing pretty good. Feels good. BP diary from home looks very good. She is tolerating her medicines well and overall thinks she is ok. No chest pain. Not short of breath. Not dizzy or lightheaded. No syncope.   Current Outpatient Prescriptions on File Prior to Visit  Medication Sig Dispense Refill  . [DISCONTINUED] hydrochlorothiazide (HYDRODIURIL) 25 MG tablet Take 1 tablet (25 mg total) by mouth daily.  90 tablet  2  . [DISCONTINUED] metoprolol succinate (TOPROL XL) 50 MG 24 hr tablet Take 1 tablet (50 mg total) by mouth daily. Take with or immediately following a meal.  90 tablet  3  . [DISCONTINUED] olmesartan (BENICAR) 40 MG tablet Take 1 tablet (40 mg total) by mouth daily.  90 tablet  3    No Known Allergies  Past Medical History  Diagnosis Date  . Cataract   . HTN (hypertension)   . Murmur   . Fractured patella     s/p repair by Dr. Shelle Iron  . LVH (left ventricular hypertrophy)     per echo April 2013  . Cardiomegaly     per CXR April 2013    Past Surgical History  Procedure Date  . Cataract extraction     History  Smoking status  . Never Smoker   Smokeless tobacco  . Not on file    History  Alcohol Use No    History reviewed. No pertinent family history.  Review of Systems: The review of systems is per the HPI.  All other systems were reviewed and are negative.  Physical Exam: BP 180/80  Pulse 52  Ht 5\' 1"  (1.549 m)  Wt 166 lb 9.6 oz (75.569 kg)  BMI 31.48 kg/m2 Patient is very pleasant and in no acute distress. Skin is warm and dry. Color is normal.  HEENT is unremarkable. Normocephalic/atraumatic. PERRL. Sclera are nonicteric. Neck is supple. No masses. No JVD.  Lungs are clear. Cardiac exam shows a regular rate and rhythm. Abdomen is soft. Extremities are without edema. Gait and ROM are intact. No gross neurologic deficits noted.   LABORATORY DATA: n/a  Lab Results  Component Value Date   WBC 10.0 07/28/2011   HGB 14.4 07/28/2011   HCT 44.4 07/28/2011   GLUCOSE 99 01/11/2012   CHOL 233* 08/02/2011   TRIG 89.0 08/02/2011   HDL 68.20 08/02/2011   LDLDIRECT 140.7 08/02/2011   LDLCALC 149* 07/28/2011   ALT 17 08/02/2011   AST 19 08/02/2011   NA 141 01/11/2012   K 4.5 01/11/2012   CL 103 01/11/2012   CREATININE 1.2 01/11/2012   BUN 26* 01/11/2012   CO2 28 01/11/2012     Assessment / Plan:  HTN - her diary from home shows satisfactory control. I have refilled her medicines today. She is doing well clinically and tolerating her current regimen. See Dr. Patty Sermons in about 4 months and will probably have fasting labs at that visit.   Patient is agreeable to this plan and will call if any problems develop in the interim.

## 2012-07-08 ENCOUNTER — Ambulatory Visit (INDEPENDENT_AMBULATORY_CARE_PROVIDER_SITE_OTHER): Payer: Medicare Other | Admitting: Cardiology

## 2012-07-08 ENCOUNTER — Encounter: Payer: Self-pay | Admitting: Cardiology

## 2012-07-08 VITALS — BP 146/82 | HR 64 | Ht 62.0 in | Wt 169.0 lb

## 2012-07-08 DIAGNOSIS — I10 Essential (primary) hypertension: Secondary | ICD-10-CM

## 2012-07-08 DIAGNOSIS — R9431 Abnormal electrocardiogram [ECG] [EKG]: Secondary | ICD-10-CM

## 2012-07-08 DIAGNOSIS — I119 Hypertensive heart disease without heart failure: Secondary | ICD-10-CM

## 2012-07-08 NOTE — Patient Instructions (Addendum)
Your physician recommends that you continue on your current medications as directed. Please refer to the Current Medication list given to you today.  Your physician wants you to follow-up in: 4 months with fasting labs (lp/bmet/hfp) You will receive a reminder letter in the mail two months in advance. If you don't receive a letter, please call our office to schedule the follow-up appointment.  

## 2012-07-08 NOTE — Progress Notes (Signed)
Regina Hunter Date of Birth:  January 14, 1927 [facility] [address] [place], [place]  [address] [phone]  Fax   [phone]  HPI:  This pleasant 79 year old woman is seen for a scheduled followup office visit. She is seen in followup for essential hypertension.   She is not having any side effects from her current regimen of hydrochlorothiazide, metoprolol, and Benicar. She denies any chest pain or shortness of breath. She previously had developed a cough from ACE inhibitors but tolerating the ARB without difficulty.  Since last visit her weight over the winter has increased slightly.   Current Outpatient Prescriptions  Medication Sig Dispense Refill  . hydrochlorothiazide (HYDRODIURIL) 25 MG tablet Take 1 tablet (25 mg total) by mouth daily.  90 tablet  3  . metoprolol succinate (TOPROL XL) 50 MG 24 hr tablet Take 1 tablet (50 mg total) by mouth daily. Take with or immediately following a meal.  90 tablet  3  . olmesartan (BENICAR) 40 MG tablet Take 1 tablet (40 mg total) by mouth daily.  90 tablet  3   No current facility-administered medications for this visit.    No Known Allergies  Patient Active Problem List  Diagnosis  . Benign hypertensive heart disease without heart failure  . Abnormal EKG    History  Smoking status  . Never Smoker   Smokeless tobacco  . Not on file    History  Alcohol Use No    No family history on file.  Review of Systems: The patient denies any heat or cold intolerance.  No weight gain or weight loss.  The patient denies headaches or blurry vision.  There is no cough or sputum production.  The patient denies dizziness.  There is no hematuria or hematochezia.  The patient denies any muscle aches or arthritis.  The patient denies any rash.  The patient denies frequent falling or instability.  There is no history of depression or anxiety.  All other systems were reviewed and are negative.   Physical Exam: Filed  Vitals:   07/08/12 0858  BP: 146/82  Pulse: 64   the general appearance reveals a well-developed well-nourished woman in no distress.The head and neck exam reveals pupils equal and reactive.  Extraocular movements are full.  There is no scleral icterus.  The mouth and pharynx are normal.  The neck is supple.  The carotids reveal no bruits.  The jugular venous pressure is normal.  The  thyroid is not enlarged.  There is no lymphadenopathy.  The chest is clear to percussion and auscultation.  There are no rales or rhonchi.  Expansion of the chest is symmetrical.  The precordium is quiet.  The first heart sound is normal.  The second heart sound is physiologically split.  There is no murmur gallop rub or click.  There is no abnormal lift or heave.  The abdomen is soft and nontender.  The bowel sounds are normal.  The liver and spleen are not enlarged.  There are no abdominal masses.  There are no abdominal bruits.  Extremities reveal good pedal pulses.  There is no phlebitis or edema.  There is no cyanosis or clubbing.  Strength is normal and symmetrical in all extremities.  There is no lateralizing weakness.  There are no sensory deficits.  The skin is warm and dry.  There is no rash.  EKG shows normal sinus rhythm and nonspecific ST-T wave abnormality, unchanged since 08/02/11    Assessment / Plan:  Continue  same medication.  Recheck in 4 months for office visit fasting lipid panel hepatic function panel and basal metabolic panel.

## 2012-07-08 NOTE — Assessment & Plan Note (Signed)
EKG today shows normal sinus rhythm and nonspecific ST-T wave changes and is unchanged since 08/02/11.

## 2012-07-08 NOTE — Assessment & Plan Note (Signed)
Her blood pressure at home is usually in the 145 systolic range which is acceptable for her age.  She is not having any chest pain shortness of breath dizziness headaches or syncope.  She's not having any cough from the ARB.

## 2012-08-23 ENCOUNTER — Telehealth: Payer: Self-pay | Admitting: Cardiology

## 2012-10-30 ENCOUNTER — Telehealth: Payer: Self-pay | Admitting: Cardiology

## 2012-10-30 NOTE — Telephone Encounter (Signed)
New Problem  Pt wants to speak with you about an appt with Dr Patty Sermons.  Pt states she never received a letter regarding scheduling one.  I offered to make patient and appt per the recall but she declined. She said she wants to speak with you before she makes any appts.

## 2012-10-30 NOTE — Telephone Encounter (Signed)
Scheduled ov and labs for patient  

## 2012-11-27 ENCOUNTER — Ambulatory Visit (INDEPENDENT_AMBULATORY_CARE_PROVIDER_SITE_OTHER): Payer: Medicare Other | Admitting: Cardiology

## 2012-11-27 ENCOUNTER — Encounter: Payer: Self-pay | Admitting: Cardiology

## 2012-11-27 ENCOUNTER — Other Ambulatory Visit (INDEPENDENT_AMBULATORY_CARE_PROVIDER_SITE_OTHER): Payer: Medicare Other

## 2012-11-27 VITALS — BP 156/88 | HR 72 | Ht 63.0 in | Wt 170.8 lb

## 2012-11-27 DIAGNOSIS — I359 Nonrheumatic aortic valve disorder, unspecified: Secondary | ICD-10-CM

## 2012-11-27 DIAGNOSIS — I119 Hypertensive heart disease without heart failure: Secondary | ICD-10-CM

## 2012-11-27 DIAGNOSIS — I350 Nonrheumatic aortic (valve) stenosis: Secondary | ICD-10-CM | POA: Insufficient documentation

## 2012-11-27 DIAGNOSIS — E7800 Pure hypercholesterolemia, unspecified: Secondary | ICD-10-CM

## 2012-11-27 LAB — HEPATIC FUNCTION PANEL
ALT: 12 U/L (ref 0–35)
AST: 15 U/L (ref 0–37)
Albumin: 4.2 g/dL (ref 3.5–5.2)
Alkaline Phosphatase: 58 U/L (ref 39–117)
Total Protein: 7.3 g/dL (ref 6.0–8.3)

## 2012-11-27 LAB — BASIC METABOLIC PANEL
CO2: 30 mEq/L (ref 19–32)
Calcium: 9.7 mg/dL (ref 8.4–10.5)
Creatinine, Ser: 1.2 mg/dL (ref 0.4–1.2)
GFR: 43.83 mL/min — ABNORMAL LOW (ref >60.00)

## 2012-11-27 LAB — LDL CHOLESTEROL, DIRECT: Direct LDL: 152.5 mg/dL

## 2012-11-27 LAB — LIPID PANEL: Cholesterol: 239 mg/dL — ABNORMAL HIGH (ref 0–200)

## 2012-11-27 MED ORDER — OLMESARTAN MEDOXOMIL 40 MG PO TABS: 40.0000 mg | ORAL_TABLET | Freq: Every day | ORAL | Status: DC

## 2012-11-27 MED ORDER — HYDROCHLOROTHIAZIDE 25 MG PO TABS: 25.0000 mg | ORAL_TABLET | Freq: Every day | ORAL | Status: DC

## 2012-11-27 MED ORDER — METOPROLOL SUCCINATE ER 50 MG PO TB24: 50.0000 mg | ORAL_TABLET | Freq: Every day | ORAL | Status: DC

## 2012-11-27 NOTE — Progress Notes (Signed)
Quick Note:  Please report to patient. The recent labs show higher LDL and cholesterol. Stay on careful diet and add Lipitor 40 mg daily. Check fasting LP and HFP and BMET in 2 months. ______

## 2012-11-27 NOTE — Assessment & Plan Note (Signed)
The patient is not having any chest pain or shortness of breath.  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

## 2012-11-27 NOTE — Patient Instructions (Signed)
Your physician recommends that you continue on your current medications as directed. Please refer to the Current Medication list given to you today.  Your physician recommends that you schedule a follow-up appointment in: 4 month ov  

## 2012-11-27 NOTE — Progress Notes (Signed)
Regina Hunter Date of Birth:  [birth year] [facility] [address] [place], [place]  [address] [phone]  Fax   [phone]  HPI: This pleasant 79 year old woman is seen for a scheduled followup office visit.  She is a recent widow. She is seen in followup for essential hypertension. She is not having any side effects from her current regimen of hydrochlorothiazide, metoprolol, and Benicar. She denies any chest pain or shortness of breath. She previously had developed a cough from ACE inhibitors but tolerating the ARB without difficulty. Since last visit her weight over the winter has increased slightly. She has a known systolic heart murmur.  Echocardiogram 08/16/11 showed ejection fraction 55-60% and mild aortic stenosis with a peak gradient of 24 and a mean gradient of 15.   Current Outpatient Prescriptions  Medication Sig Dispense Refill  . hydrochlorothiazide (HYDRODIURIL) 25 MG tablet Take 1 tablet (25 mg total) by mouth daily.  90 tablet  3  . metoprolol succinate (TOPROL XL) 50 MG 24 hr tablet Take 1 tablet (50 mg total) by mouth daily. Take with or immediately following a meal.  90 tablet  3  . olmesartan (BENICAR) 40 MG tablet Take 1 tablet (40 mg total) by mouth daily.  90 tablet  3   No current facility-administered medications for this visit.    No Known Allergies  Patient Active Problem List   Diagnosis Date Noted  . Pure hypercholesterolemia 11/27/2012  . Aortic stenosis, mild 11/27/2012  . Benign hypertensive heart disease without heart failure 08/02/2011  . Abnormal EKG 08/02/2011    History  Smoking status  . Never Smoker   Smokeless tobacco  . Not on file    History  Alcohol Use No    No family history on file.  Review of Systems: The patient denies any heat or cold intolerance.  No weight gain or weight loss.  The patient denies headaches or blurry vision.  There is no cough or sputum production.  The patient denies  dizziness.  There is no hematuria or hematochezia.  The patient denies any muscle aches or arthritis.  The patient denies any rash.  The patient denies frequent falling or instability.  There is no history of depression or anxiety.  All other systems were reviewed and are negative.   Physical Exam: Filed Vitals:   11/27/12 1044  BP: 156/88  Pulse: 72   the general appearance reveals a well-developed well-nourished woman in no distress.The head and neck exam reveals pupils equal and reactive.  Extraocular movements are full.  There is no scleral icterus.  The mouth and pharynx are normal.  The neck is supple.  The carotids reveal no bruits.  The jugular venous pressure is normal.  The  thyroid is not enlarged.  There is no lymphadenopathy.  The chest is clear to percussion and auscultation.  There are no rales or rhonchi.  Expansion of the chest is symmetrical.  The precordium is quiet.  The first heart sound is normal.  The second heart sound is physiologically split.  There is no  gallop rub or click.  There is a soft apical systolic murmur.  There is no abnormal lift or heave.  The abdomen is soft and nontender.  The bowel sounds are normal.  The liver and spleen are not enlarged.  There are no abdominal masses.  There are no abdominal bruits.  Extremities reveal good pedal pulses.  There is no phlebitis or edema.  There is no cyanosis or  clubbing.  Strength is normal and symmetrical in all extremities.  There is no lateralizing weakness.  There are no sensory deficits.  The skin is warm and dry.  There is no rash.      Assessment / Plan: Continue same medication.  Recheck in 4 months.  Continue to try to lose weight.

## 2012-11-27 NOTE — Assessment & Plan Note (Signed)
Patient has a history of mild hypercholesterolemia treated with diet.  She is not on statin therapy

## 2012-11-28 ENCOUNTER — Telehealth: Payer: Self-pay | Admitting: Cardiology

## 2012-11-28 NOTE — Telephone Encounter (Signed)
Advised patient of lab results. Patient wants to work harder on diet and recheck at next [facility]

## 2012-11-28 NOTE — Telephone Encounter (Signed)
Follow Up ° °Pt returning call from earlier. Please call. °

## 2012-11-28 NOTE — Telephone Encounter (Signed)
Message copied by Burnell Blanks on Thu Nov 28, 2012  6:46 PM ------      Message from: Cassell Clement      Created: Wed Nov 27, 2012  9:02 PM       Please report to patient.  The recent labs show higher LDL and cholesterol. Stay on careful diet and add Lipitor 40 mg daily. Check fasting LP and HFP and BMET in 2 months. ------

## 2012-12-04 ENCOUNTER — Other Ambulatory Visit: Payer: Self-pay

## 2012-12-05 ENCOUNTER — Other Ambulatory Visit: Payer: Self-pay | Admitting: Nurse Practitioner

## 2012-12-05 DIAGNOSIS — E7800 Pure hypercholesterolemia, unspecified: Secondary | ICD-10-CM

## 2012-12-05 DIAGNOSIS — I119 Hypertensive heart disease without heart failure: Secondary | ICD-10-CM

## 2013-01-25 IMAGING — CR DG CHEST 2V
2 series · 2 of 2 positions shown · non-contrast
Comparison: 03/26/2009.

CLINICAL DATA: History of hypertension.

CHEST - 2 VIEW

[view not recorded (1 of 2)]
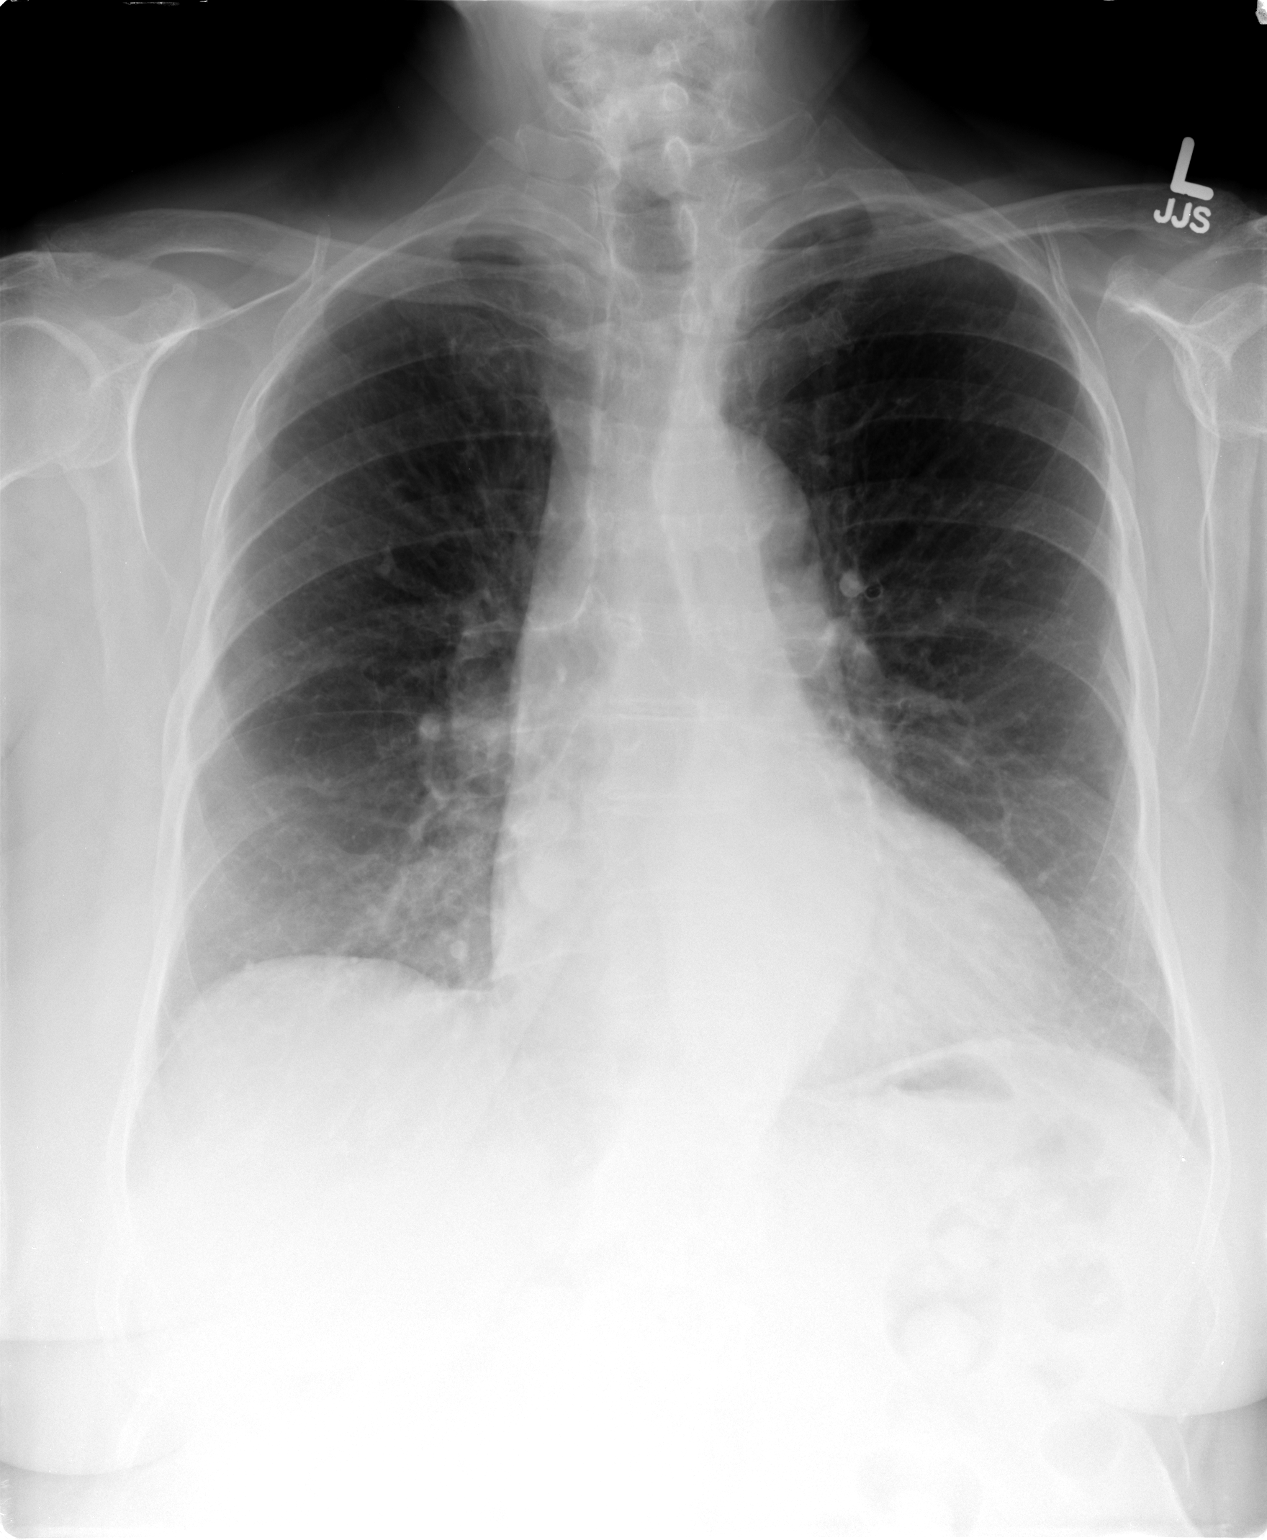

[view not recorded (2 of 2)]
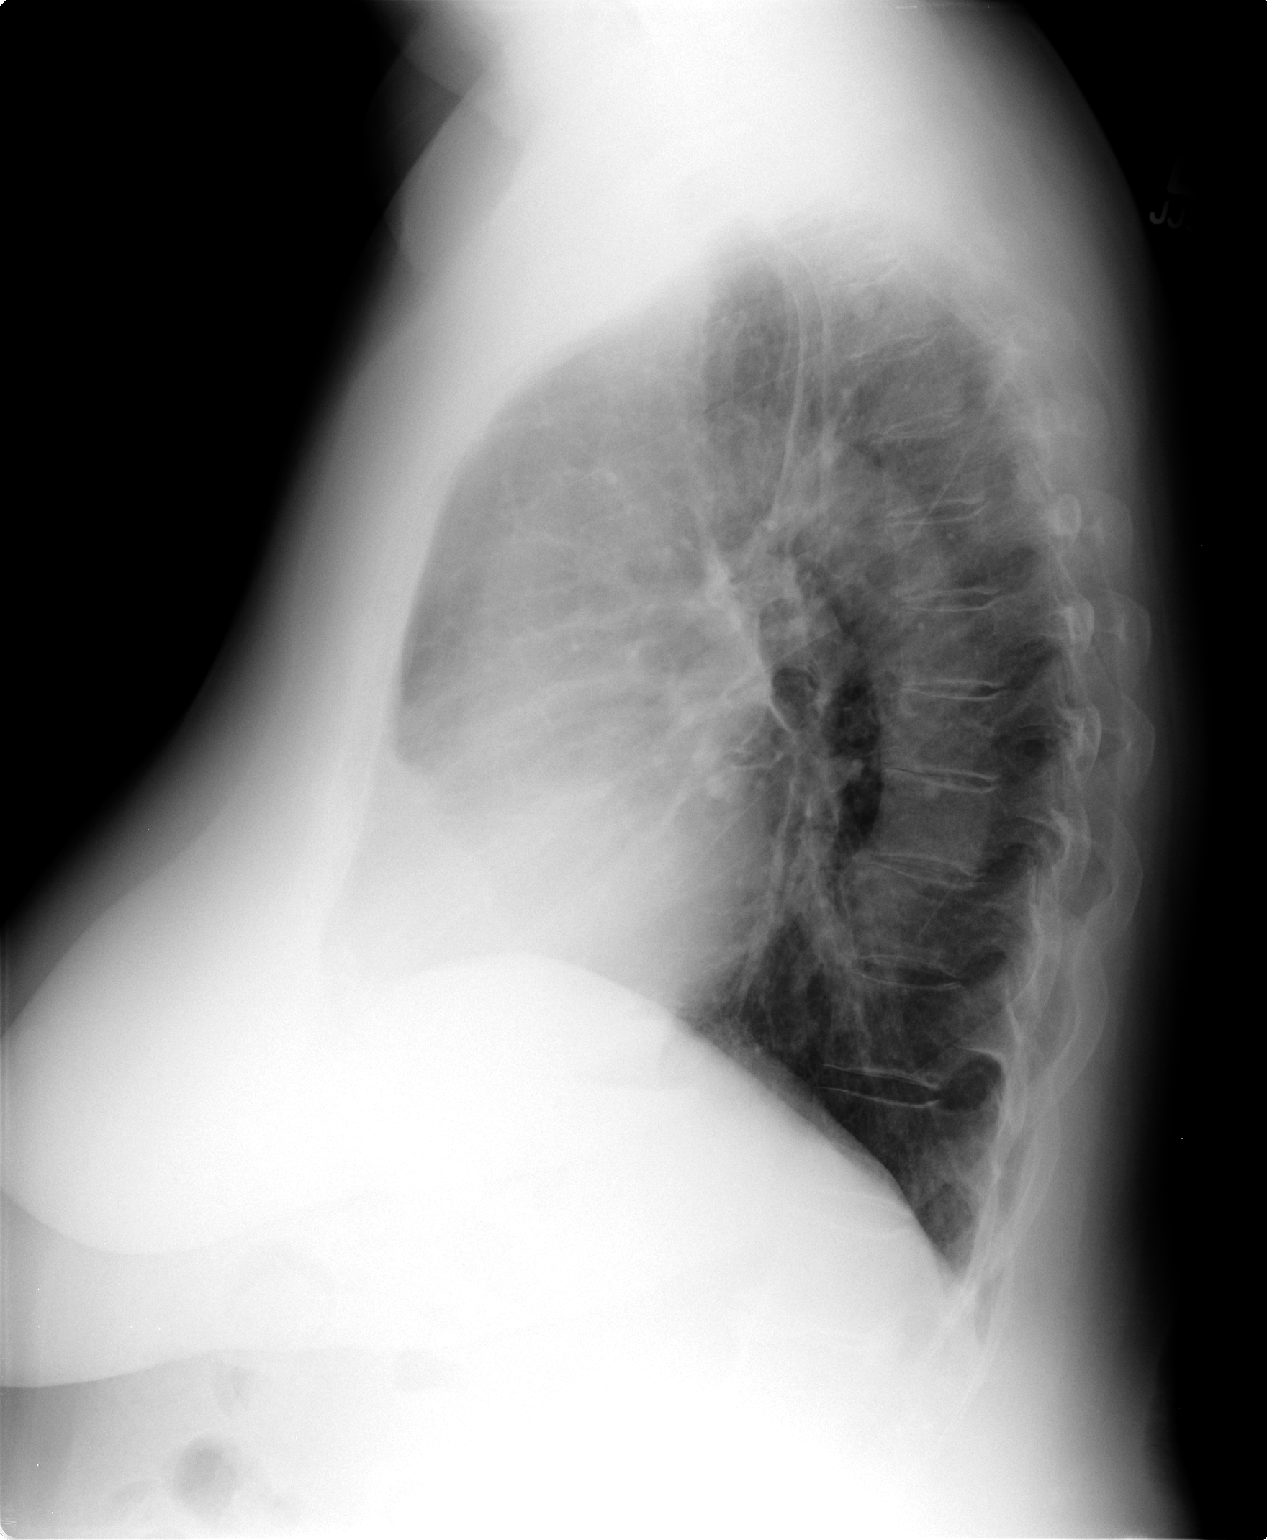

[2 of 2 positions shown; findings below may reference images not displayed]

FINDINGS: There is stable minimal cardiac silhouette enlargement.
Ectasia, tortuosity, and nonaneurysmal calcification of the
thoracic aorta are seen. Mediastinal and hilar contours appear
stable.  No pulmonary infiltrates or nodules were evident. No
pleural abnormality is evident.  Changes of degenerative disc
disease and degenerative spondylosis are seen.  There is osteopenic
appearance of bones.
IMPRESSION: Stable minimal cardiac silhouette enlargement.  No pulmonary edema,
pneumonia, or pleural effusion.

## 2013-03-06 ENCOUNTER — Other Ambulatory Visit: Payer: Self-pay | Admitting: Nurse Practitioner

## 2013-03-06 ENCOUNTER — Other Ambulatory Visit: Payer: Self-pay

## 2013-05-28 ENCOUNTER — Ambulatory Visit (INDEPENDENT_AMBULATORY_CARE_PROVIDER_SITE_OTHER): Payer: Medicare Other | Admitting: Cardiology

## 2013-05-28 ENCOUNTER — Other Ambulatory Visit: Payer: Medicare Other

## 2013-05-28 ENCOUNTER — Encounter: Payer: Self-pay | Admitting: Cardiology

## 2013-05-28 VITALS — BP 142/80 | HR 66 | Ht 63.0 in | Wt 172.0 lb

## 2013-05-28 DIAGNOSIS — E7800 Pure hypercholesterolemia, unspecified: Secondary | ICD-10-CM

## 2013-05-28 DIAGNOSIS — I119 Hypertensive heart disease without heart failure: Secondary | ICD-10-CM

## 2013-05-28 DIAGNOSIS — I350 Nonrheumatic aortic (valve) stenosis: Secondary | ICD-10-CM

## 2013-05-28 DIAGNOSIS — I359 Nonrheumatic aortic valve disorder, unspecified: Secondary | ICD-10-CM

## 2013-05-28 LAB — BASIC METABOLIC PANEL
BUN: 42 mg/dL — ABNORMAL HIGH (ref 6–23)
CO2: 31 mEq/L (ref 19–32)
Calcium: 9.7 mg/dL (ref 8.4–10.5)
Chloride: 101 mEq/L (ref 96–112)
Creatinine, Ser: 1.5 mg/dL — ABNORMAL HIGH (ref 0.4–1.2)
GFR: 36.26 mL/min — AB (ref >60.00)
GLUCOSE: 96 mg/dL (ref 70–99)
Potassium: 4.8 mEq/L (ref 3.5–5.1)
Sodium: 139 mEq/L (ref 135–145)

## 2013-05-28 LAB — HEPATIC FUNCTION PANEL
ALT: 14 U/L (ref 0–35)
AST: 19 U/L (ref 0–37)
Albumin: 4.2 g/dL (ref 3.5–5.2)
Alkaline Phosphatase: 68 U/L (ref 39–117)
Bilirubin, Direct: 0 mg/dL (ref 0.0–0.3)
Total Bilirubin: 0.7 mg/dL (ref 0.3–1.2)
Total Protein: 7.7 g/dL (ref 6.0–8.3)

## 2013-05-28 LAB — LIPID PANEL
CHOL/HDL RATIO: 4
Cholesterol: 238 mg/dL — ABNORMAL HIGH (ref 0–200)
HDL: 59.7 mg/dL (ref >39.00)
Triglycerides: 129 mg/dL (ref 0.0–149.0)
VLDL: 25.8 mg/dL (ref 0.0–40.0)

## 2013-05-28 LAB — LDL CHOLESTEROL, DIRECT: Direct LDL: 156.5 mg/dL

## 2013-05-28 NOTE — Assessment & Plan Note (Signed)
Symptoms from her mild aortic stenosis

## 2013-05-28 NOTE — Assessment & Plan Note (Signed)
The patient has mild hypercholesterolemia.  She is not on any statin therapy at this point.  We are rechecking labs today.  She is going to try harder to lose weight

## 2013-05-28 NOTE — Progress Notes (Signed)
Regina MerlesHelen Hunter Date of Birth:  [birth year] [address] [place], [place]  [id number] [phone]  Fax   [phone]  HPI: This pleasant 80 year old woman is seen for a scheduled followup office visit.  She is a recent widow. She is seen in followup for essential hypertension. She is not having any side effects from her current regimen of hydrochlorothiazide, metoprolol, and Benicar. She denies any chest pain or shortness of breath. She previously had developed a cough from ACE inhibitors but tolerating the ARB without difficulty. Since last visit her weight over the winter has increased slightly. She has a known systolic heart murmur.  Echocardiogram 08/16/11 showed ejection fraction 55-60% and mild aortic stenosis with a peak gradient of 24 and a mean gradient of 15. Her blood pressure here in the office and he was slightly elevated.  On recheck it was down to 142/80.  At home her blood pressure has been in the 135-145 range.  Current Outpatient Prescriptions  Medication Sig Dispense Refill  . hydrochlorothiazide (HYDRODIURIL) 25 MG tablet Take 1 tablet (25 mg total) by mouth daily.  90 tablet  3  . metoprolol succinate (TOPROL XL) 50 MG 24 hr tablet Take 1 tablet (50 mg total) by mouth daily. Take with or immediately following a meal.  90 tablet  3  . olmesartan (BENICAR) 40 MG tablet Take 1 tablet (40 mg total) by mouth daily.  90 tablet  3   No current facility-administered medications for this visit.    No Known Allergies  Patient Active Problem List   Diagnosis Date Noted  . Pure hypercholesterolemia 11/27/2012  . Aortic stenosis, mild 11/27/2012  . Benign hypertensive heart disease without heart failure 08/02/2011  . Abnormal EKG 08/02/2011    History  Smoking status  . Never Smoker   Smokeless tobacco  . Not on file    History  Alcohol Use No    No family history on file.  Review of Systems: The patient denies any heat or cold intolerance.  No  weight gain or weight loss.  The patient denies headaches or blurry vision.  There is no cough or sputum production.  The patient denies dizziness.  There is no hematuria or hematochezia.  The patient denies any muscle aches or arthritis.  The patient denies any rash.  The patient denies frequent falling or instability.  There is no history of depression or anxiety.  All other systems were reviewed and are negative.   Physical Exam: Filed Vitals:   05/28/13 0902  BP: 142/80  Pulse:    the general appearance reveals a well-developed well-nourished woman in no distress.The head and neck exam reveals pupils equal and reactive.  Extraocular movements are full.  There is no scleral icterus.  The mouth and pharynx are normal.  The neck is supple.  The carotids reveal no bruits.  The jugular venous pressure is normal.  The  thyroid is not enlarged.  There is no lymphadenopathy.  The chest is clear to percussion and auscultation.  There are no rales or rhonchi.  Expansion of the chest is symmetrical.  The precordium is quiet.  The first heart sound is normal.  The second heart sound is physiologically split.  There is no  gallop rub or click.  There is a soft apical systolic murmur.  There is no abnormal lift or heave.  The abdomen is soft and nontender.  The bowel sounds are normal.  The liver and spleen are not enlarged.  There are no abdominal masses.  There are no abdominal bruits.  Extremities reveal good pedal pulses.  There is no phlebitis or edema.  There is no cyanosis or clubbing.  Strength is normal and symmetrical in all extremities.  There is no lateralizing weakness.  There are no sensory deficits.  The skin is warm and dry.  There is no rash.      Assessment / Plan: Continue same medication.  Recheck in 4 months for office visit lipid panel hepatic function panel and basal metabolic panel. Continue to try to lose weight.

## 2013-05-28 NOTE — Assessment & Plan Note (Signed)
The patient has not had any chest pain or shortness of breath.  No dizzy spells.  No headaches.  She has been feeling well

## 2013-05-28 NOTE — Patient Instructions (Signed)
Will obtain labs today and call you with the results (lp/bmet/hfp)  Your physician recommends that you continue on your current medications as directed. Please refer to the Current Medication list given to you today.  Your physician wants you to follow-up in: 6 months with fasting labs (lp/bmet/hfp)  You will receive a reminder letter in the mail two months in advance. If you don't receive a letter, please call our office to schedule the follow-up appointment.  

## 2013-05-29 ENCOUNTER — Telehealth: Payer: Self-pay | Admitting: *Deleted

## 2013-05-29 DIAGNOSIS — E7800 Pure hypercholesterolemia, unspecified: Secondary | ICD-10-CM

## 2013-05-29 DIAGNOSIS — I119 Hypertensive heart disease without heart failure: Secondary | ICD-10-CM

## 2013-05-29 NOTE — Telephone Encounter (Signed)
Message copied by Burnell BlanksPRATT, Asalee Barrette B on Thu May 29, 2013  2:32 PM ------      Message from: Cassell ClementBRACKBILL, THOMAS      Created: Thu May 29, 2013  7:47 AM       Please report.  The kidney function is not as good.  I want her to cut back on her HCTZ to just 12.5 mg daily and to drink more water.  To help keep blood pressure down I want her to add amlodipine 5 mg one daily.  Her LDL level is still quite high at 156 and I would like her to add low-dose generic Lipitor 10 mg daily.  Recheck fasting lipid panel and basal metabolic panel and hepatic function panel in about 2 months. ------

## 2013-06-02 ENCOUNTER — Other Ambulatory Visit: Payer: Self-pay | Admitting: *Deleted

## 2013-06-02 DIAGNOSIS — E7800 Pure hypercholesterolemia, unspecified: Secondary | ICD-10-CM

## 2013-06-02 MED ORDER — AMLODIPINE BESYLATE 5 MG PO TABS: 5.0000 mg | ORAL_TABLET | Freq: Every day | ORAL | Status: DC

## 2013-06-02 MED ORDER — SIMVASTATIN 20 MG PO TABS: 20.0000 mg | ORAL_TABLET | Freq: Every day | ORAL | Status: DC

## 2013-06-02 NOTE — Telephone Encounter (Signed)
Advised patient and scheduled labs. Rx's sent to mail order and 15 day supply of Amlodipine called to [facility]   Notes Recorded by Burnell BlanksMelinda B Lexis Potenza on 05/29/2013 at 6:30 PM Patient does not want to start lipitor. discussed with Dr. Patty SermonsBrackbill and ok to try generic Zocor 20 mg daily

## 2013-06-02 NOTE — Telephone Encounter (Signed)
Message copied by Burnell BlanksPRATT, Haileyann Staiger B on Mon Jun 02, 2013  4:34 PM ------      Message from: Cassell ClementBRACKBILL, THOMAS      Created: Thu May 29, 2013  7:47 AM       Please report.  The kidney function is not as good.  I want her to cut back on her HCTZ to just 12.5 mg daily and to drink more water.  To help keep blood pressure down I want her to add amlodipine 5 mg one daily.  Her LDL level is still quite high at 156 and I would like her to add low-dose generic Lipitor 10 mg daily.  Recheck fasting lipid panel and basal metabolic panel and hepatic function panel in about 2 months. ------

## 2013-07-31 ENCOUNTER — Other Ambulatory Visit (INDEPENDENT_AMBULATORY_CARE_PROVIDER_SITE_OTHER): Payer: Medicare Other

## 2013-07-31 DIAGNOSIS — E7800 Pure hypercholesterolemia, unspecified: Secondary | ICD-10-CM

## 2013-07-31 LAB — BASIC METABOLIC PANEL
BUN: 36 mg/dL — AB (ref 6–23)
CALCIUM: 9.4 mg/dL (ref 8.4–10.5)
CO2: 29 mEq/L (ref 19–32)
CREATININE: 1.3 mg/dL — AB (ref 0.4–1.2)
Chloride: 100 mEq/L (ref 96–112)
GFR: 40.01 mL/min — AB (ref >60.00)
GLUCOSE: 90 mg/dL (ref 70–99)
Potassium: 4.2 mEq/L (ref 3.5–5.1)
Sodium: 139 mEq/L (ref 135–145)

## 2013-07-31 LAB — LIPID PANEL
Cholesterol: 233 mg/dL — ABNORMAL HIGH (ref 0–200)
HDL: 56.8 mg/dL (ref >39.00)
LDL Cholesterol: 150 mg/dL — ABNORMAL HIGH (ref 0–99)
Total CHOL/HDL Ratio: 4
Triglycerides: 132 mg/dL (ref 0.0–149.0)
VLDL: 26.4 mg/dL (ref 0.0–40.0)

## 2013-07-31 LAB — HEPATIC FUNCTION PANEL
ALK PHOS: 64 U/L (ref 39–117)
ALT: 12 U/L (ref 0–35)
AST: 17 U/L (ref 0–37)
Albumin: 4 g/dL (ref 3.5–5.2)
BILIRUBIN DIRECT: 0.1 mg/dL (ref 0.0–0.3)
BILIRUBIN TOTAL: 0.8 mg/dL (ref 0.3–1.2)
Total Protein: 7.5 g/dL (ref 6.0–8.3)

## 2013-08-01 ENCOUNTER — Telehealth: Payer: Self-pay | Admitting: *Deleted

## 2013-08-01 MED ORDER — EZETIMIBE 10 MG PO TABS: 10.0000 mg | ORAL_TABLET | Freq: Every day | ORAL | Status: DC

## 2013-08-01 NOTE — Telephone Encounter (Signed)
Advised patient

## 2013-08-01 NOTE — Telephone Encounter (Signed)
Spoke with patient and she stated she only takes the Simvastatin about 2 days a week secondary to it making her feel tired, muscle pain, and swelling. Advised patient to stop Simvastatin and will forward to  Dr. Patty SermonsBrackbill for recommendations.

## 2013-08-01 NOTE — Telephone Encounter (Signed)
Message copied by Burnell BlanksPRATT, MELINDA B on Fri Aug 01, 2013  1:27 PM ------      Message from: Cassell ClementBRACKBILL, THOMAS      Created: Fri Aug 01, 2013  9:54 AM       The cholesterol has not improved much since she started the simvastatin last time. LDL was 156, now 150.  We cannot go up on the simvastatin because of the amlodipine interaction.  Add Zetia 10 mg daily. ------

## 2013-08-01 NOTE — Telephone Encounter (Signed)
Message copied by Burnell BlanksPRATT, Maysun Meditz B on Fri Aug 01, 2013  6:30 PM ------      Message from: Cassell ClementBRACKBILL, THOMAS      Created: Fri Aug 01, 2013  3:49 PM       Stop simvastation and switch to Zetia 10 mg daily ------

## 2014-01-21 ENCOUNTER — Encounter: Payer: Self-pay | Admitting: Cardiology

## 2014-01-21 ENCOUNTER — Ambulatory Visit (INDEPENDENT_AMBULATORY_CARE_PROVIDER_SITE_OTHER): Payer: 59 | Admitting: Cardiology

## 2014-01-21 VITALS — BP 136/74 | HR 65 | Ht 63.0 in | Wt 171.0 lb

## 2014-01-21 DIAGNOSIS — E7800 Pure hypercholesterolemia, unspecified: Secondary | ICD-10-CM

## 2014-01-21 DIAGNOSIS — I119 Hypertensive heart disease without heart failure: Secondary | ICD-10-CM

## 2014-01-21 DIAGNOSIS — I359 Nonrheumatic aortic valve disorder, unspecified: Secondary | ICD-10-CM

## 2014-01-21 DIAGNOSIS — I350 Nonrheumatic aortic (valve) stenosis: Secondary | ICD-10-CM

## 2014-01-21 LAB — LIPID PANEL
Cholesterol: 235 mg/dL — ABNORMAL HIGH (ref 0–200)
HDL: 51.4 mg/dL (ref >39.00)
LDL Cholesterol: 152 mg/dL — ABNORMAL HIGH (ref 0–99)
NONHDL: 183.6
Total CHOL/HDL Ratio: 5
Triglycerides: 160 mg/dL — ABNORMAL HIGH (ref 0.0–149.0)
VLDL: 32 mg/dL (ref 0.0–40.0)

## 2014-01-21 LAB — BASIC METABOLIC PANEL
BUN: 47 mg/dL — ABNORMAL HIGH (ref 6–23)
CHLORIDE: 100 meq/L (ref 96–112)
CO2: 31 meq/L (ref 19–32)
Calcium: 9.2 mg/dL (ref 8.4–10.5)
Creatinine, Ser: 1.5 mg/dL — ABNORMAL HIGH (ref 0.4–1.2)
GFR: 34.56 mL/min — ABNORMAL LOW (ref >60.00)
Glucose, Bld: 93 mg/dL (ref 70–99)
POTASSIUM: 4.1 meq/L (ref 3.5–5.1)
SODIUM: 137 meq/L (ref 135–145)

## 2014-01-21 LAB — HEPATIC FUNCTION PANEL
ALT: 23 U/L (ref 0–35)
AST: 40 U/L — ABNORMAL HIGH (ref 0–37)
Albumin: 4.1 g/dL (ref 3.5–5.2)
Alkaline Phosphatase: 62 U/L (ref 39–117)
Bilirubin, Direct: 0.1 mg/dL (ref 0.0–0.3)
Total Bilirubin: 0.7 mg/dL (ref 0.2–1.2)
Total Protein: 7.7 g/dL (ref 6.0–8.3)

## 2014-01-21 MED ORDER — METOPROLOL SUCCINATE ER 50 MG PO TB24: 50.0000 mg | ORAL_TABLET | Freq: Every day | ORAL | Status: DC

## 2014-01-21 MED ORDER — OLMESARTAN MEDOXOMIL 40 MG PO TABS
40.0000 mg | ORAL_TABLET | Freq: Every day | ORAL | Status: DC
Start: 2014-01-21 — End: 2015-02-16

## 2014-01-21 MED ORDER — HYDROCHLOROTHIAZIDE 25 MG PO TABS
25.0000 mg | ORAL_TABLET | Freq: Every day | ORAL | Status: DC
Start: 2014-01-21 — End: 2014-01-22

## 2014-01-21 NOTE — Progress Notes (Signed)
Quick Note:  Please report to patient. The recent labs are stable. Continue same medication and careful diet. The kidneys are drier. Reduce HCTZ to 12.5 mg daily and drink more water. ______

## 2014-01-21 NOTE — Progress Notes (Signed)
     Regina Hunter Date of Birth:  02/14/1927 [facility] [address] [place], [place]  [address] [phone]        Fax   [phone]   History of Present Illness: This pleasant 80 year old woman is seen for a scheduled followup office visit. She is a recent widow. She is seen in followup for essential hypertension. She is not having any side effects from her current regimen of hydrochlorothiazide, metoprolol, and Benicar. She denies any chest pain or shortness of breath. She previously had developed a cough from ACE inhibitors but tolerating the ARB without difficulty. Since last visit her weight is down slightly.  She has a known systolic heart murmur. Echocardiogram 08/16/11 showed ejection fraction 55-60% and mild aortic stenosis with a peak gradient of 24 and a mean gradient of 15.  The patient is not having any cardiac symptoms.  No chest pain or shortness of breath.  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   Current Outpatient Prescriptions  Medication Sig Dispense Refill  . amLODipine (NORVASC) 5 MG tablet Take 1 tablet (5 mg total) by mouth daily.  90 tablet  3  . ezetimibe (ZETIA) 10 MG tablet Take 1 tablet (10 mg total) by mouth daily.  90 tablet  3  . hydrochlorothiazide (HYDRODIURIL) 25 MG tablet Take 1 tablet (25 mg total) by mouth daily.  90 tablet  3  . metoprolol succinate (TOPROL XL) 50 MG 24 hr tablet Take 1 tablet (50 mg total) by mouth daily. Take with or immediately following a meal.  90 tablet  3  . olmesartan (BENICAR) 40 MG tablet Take 1 tablet (40 mg total) by mouth daily.  90 tablet  3   No current facility-administered medications for this visit.    Allergies  Allergen Reactions  . Simvastatin     Fatigue, swelling, and muscle pain    Patient Active Problem List   Diagnosis Date Noted  . Pure hypercholesterolemia 11/27/2012  . Aortic stenosis, mild 11/27/2012  . Benign  hypertensive heart disease without heart failure 08/02/2011  . Abnormal EKG 08/02/2011    History  Smoking status  . Never Smoker   Smokeless tobacco  . Not on file    History  Alcohol Use No    No family history on file.  Review of Systems: Constitutional: no fever chills diaphoresis or fatigue or change in weight.  Head and neck: no hearing loss, no epistaxis, no photophobia or visual disturbance. Respiratory: No cough, shortness of breath or wheezing. Cardiovascular: No chest pain peripheral edema, palpitations. Gastrointestinal: No abdominal distention, no abdominal pain, no change in bowel habits hematochezia or melena. Genitourinary: No dysuria, no frequency, no urgency, no nocturia. Musculoskeletal:No arthralgias, no back pain, no gait disturbance or myalgias. Neurological: No dizziness, no headaches, no numbness, no seizures, no syncope, no weakness, no tremors. Hematologic: No lymphadenopathy, no easy bruising. Psychiatric: No confusion, no hallucinations, no sleep disturbance.    Physical Exam: Filed Vitals:   01/21/14 0907  BP: 136/74  Pulse: 65   EKG shows normal sinus rhythm and no ischemic changes.  Assessment / Plan: 1. essential hypertension without heart failure 2. mild aortic stenosis 3. Hypercholesterolemia  Disposition: Continue same medication.  Continue to watch diet carefully.  Recheck in 6 months for office visit lipid panel hepatic function panel and basal metabolic panel.

## 2014-01-21 NOTE — Patient Instructions (Signed)
Will obtain labs today and call you with the results (lp/bmet/hfp)  Your physician recommends that you continue on your current medications as directed. Please refer to the Current Medication list given to you today.  Your physician wants you to follow-up in: 6 months with fasting labs (lp/bmet/hfp)  You will receive a reminder letter in the mail two months in advance. If you don't receive a letter, please call our office to schedule the follow-up appointment.  

## 2014-01-21 NOTE — Assessment & Plan Note (Signed)
No chest pain or shortness of breath.  No dizziness or syncope. No palpitations.

## 2014-01-21 NOTE — Assessment & Plan Note (Signed)
She has hypercholesterolemia.  She is intolerant of statins.  She is tolerating ezetimibe without side effects.

## 2014-01-21 NOTE — Assessment & Plan Note (Signed)
No symptoms from her aortic valve disease.

## 2014-01-22 ENCOUNTER — Telehealth: Payer: Self-pay | Admitting: *Deleted

## 2014-01-22 NOTE — Telephone Encounter (Signed)
Advised patient of lab results and medication change  

## 2014-01-22 NOTE — Telephone Encounter (Signed)
Message copied by Burnell Blanks on Thu Jan 22, 2014  1:50 PM ------      Message from: Cassell Clement      Created: Wed Jan 21, 2014  7:09 PM       Please report to patient.  The recent labs are stable. Continue same medication and careful diet. The kidneys are drier. Reduce HCTZ to 12.5 mg daily and drink more water. ------

## 2014-02-10 NOTE — Telephone Encounter (Signed)
No information given/tmj 

## 2014-04-01 ENCOUNTER — Telehealth: Payer: Self-pay | Admitting: Cardiology

## 2014-04-01 NOTE — Telephone Encounter (Signed)
New Msg ° ° °Left vm for pt to inform of flu clinic Sat.  °

## 2014-06-18 ENCOUNTER — Telehealth: Payer: Self-pay | Admitting: Cardiology

## 2014-06-18 NOTE — Telephone Encounter (Signed)
Pt scheduled recall with Brackbill , does she need fasting labs this visit?

## 2014-06-18 NOTE — Telephone Encounter (Signed)
Patient will get labs day of her appointment

## 2014-07-08 ENCOUNTER — Ambulatory Visit (INDEPENDENT_AMBULATORY_CARE_PROVIDER_SITE_OTHER): Payer: Medicare Other | Admitting: Cardiology

## 2014-07-08 ENCOUNTER — Encounter: Payer: Self-pay | Admitting: Cardiology

## 2014-07-08 VITALS — BP 154/78 | HR 77 | Ht 63.0 in | Wt 173.0 lb

## 2014-07-08 DIAGNOSIS — I119 Hypertensive heart disease without heart failure: Secondary | ICD-10-CM | POA: Diagnosis not present

## 2014-07-08 DIAGNOSIS — E7800 Pure hypercholesterolemia, unspecified: Secondary | ICD-10-CM

## 2014-07-08 DIAGNOSIS — I350 Nonrheumatic aortic (valve) stenosis: Secondary | ICD-10-CM

## 2014-07-08 LAB — BASIC METABOLIC PANEL
BUN: 32 mg/dL — AB (ref 6–23)
CHLORIDE: 102 meq/L (ref 96–112)
CO2: 31 mEq/L (ref 19–32)
Calcium: 9.7 mg/dL (ref 8.4–10.5)
Creatinine, Ser: 1.37 mg/dL — ABNORMAL HIGH (ref 0.40–1.20)
GFR: 38.92 mL/min — ABNORMAL LOW (ref >60.00)
Glucose, Bld: 104 mg/dL — ABNORMAL HIGH (ref 70–99)
Potassium: 4.9 mEq/L (ref 3.5–5.1)
SODIUM: 139 meq/L (ref 135–145)

## 2014-07-08 LAB — HEPATIC FUNCTION PANEL
ALBUMIN: 4.4 g/dL (ref 3.5–5.2)
ALK PHOS: 62 U/L (ref 39–117)
ALT: 13 U/L (ref 0–35)
AST: 17 U/L (ref 0–37)
BILIRUBIN DIRECT: 0.2 mg/dL (ref 0.0–0.3)
Total Bilirubin: 0.5 mg/dL (ref 0.2–1.2)
Total Protein: 7.7 g/dL (ref 6.0–8.3)

## 2014-07-08 LAB — LIPID PANEL
Cholesterol: 220 mg/dL — ABNORMAL HIGH (ref 0–200)
HDL: 60.9 mg/dL (ref >39.00)
LDL CALC: 135 mg/dL — AB (ref 0–99)
NonHDL: 159.1
TRIGLYCERIDES: 123 mg/dL (ref 0.0–149.0)
Total CHOL/HDL Ratio: 4
VLDL: 24.6 mg/dL (ref 0.0–40.0)

## 2014-07-08 NOTE — Patient Instructions (Signed)
Will obtain labs today and call you with the results (bmet/hfp/lp)  Your physician recommends that you continue on your current medications as directed. Please refer to the Current Medication list given to you today.  Your physician wants you to follow-up in: 6 months with fasting labs (lp/bmet/hfp)  You will receive a reminder letter in the mail two months in advance. If you don't receive a letter, please call our office to schedule the follow-up appointment.

## 2014-07-08 NOTE — Progress Notes (Signed)
Cardiology Office Note   Date:  07/08/2014   ID:  Regina Hunter, DOB 03-Jun-1926, MRN [id number]  PCP:  Cassell Clement, MD  Cardiologist:   Cassell Clement, MD   No chief complaint on file.     History of Present Illness: Regina Hunter is a 81 y.o. female who presents for a six-month office visit  This pleasant 81 year old woman is seen for a scheduled followup office visit. She is a recent widow. She is seen in followup for essential hypertension. She is not having any side effects from her current regimen of hydrochlorothiazide, metoprolol, and Benicar.  At her last visit we put her on amlodipine but she developed severe edema.  She also felt that she had side effects from the ezetimibe and stopped that.  She denies any chest pain or shortness of breath. She previously had developed a cough from ACE inhibitors but tolerating the ARB without difficulty.  She has a known systolic heart murmur. Echocardiogram 08/16/11 showed ejection fraction 55-60% and mild aortic stenosis with a peak gradient of 24 and a mean gradient of 15.  The patient is not having any cardiac symptoms. No chest pain or shortness of breath. 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  Past Medical History  Diagnosis Date  . Cataract   . HTN (hypertension)   . Murmur   . Fractured patella     s/p repair by Dr. Shelle Iron  . LVH (left ventricular hypertrophy)     per echo April 2013  . Cardiomegaly     per CXR April 2013    Past Surgical History  Procedure Laterality Date  . Cataract extraction       Current Outpatient Prescriptions  Medication Sig Dispense Refill  . hydrochlorothiazide (HYDRODIURIL) 25 MG tablet Take 25 mg by mouth as directed. 1/2 tablet daily    . metoprolol succinate (TOPROL XL) 50 MG 24 hr tablet Take 1 tablet (50 mg total) by mouth daily. Take with or immediately following a meal. 90 tablet 3  . olmesartan (BENICAR) 40 MG tablet  Take 1 tablet (40 mg total) by mouth daily. 90 tablet 3   No current facility-administered medications for this visit.    Allergies:   Amlodipine and Simvastatin    Social History:  The patient  reports that she has never smoked. She does not have any smokeless tobacco history on file. She reports that she does not drink alcohol or use illicit drugs.   Family History:  Father died post-op infection after ulcer surgery. Mother died CHF at 18   ROS:  Please see the history of present illness.   Otherwise, review of systems are positive for none.   All other systems are reviewed and negative.    PHYSICAL EXAM: VS:  BP 154/78 mmHg  Pulse 77  Ht  (1.6 m)  Wt 173 lb (78.472 kg)  BMI 30.65 kg/m2 , BMI Body mass index is 30.65 kg/(m^2). GEN: Well nourished, well developed, in no acute distress HEENT: normal Neck: no JVD, carotid bruits, or masses Cardiac: RRR; no  rubs, or gallops,no edema .  Rate 1/6 systolic murmur at base. Respiratory:  clear to auscultation bilaterally, normal work of breathing GI: soft, nontender, nondistended, + BS MS: no deformity or atrophy Skin: warm and dry, no rash Neuro:  Strength and sensation are intact Psych: euthymic mood, full affect   EKG:  EKG is ordered today.    Recent  Labs: 01/21/2014: ALT 23; BUN 47*; Creatinine 1.5*; Potassium 4.1; Sodium 137    Lipid Panel    Component Value Date/Time   CHOL 235* 01/21/2014 0946   TRIG 160.0* 01/21/2014 0946   HDL 51.40 01/21/2014 0946   CHOLHDL 5 01/21/2014 0946   VLDL 32.0 01/21/2014 0946   LDLCALC 152* 01/21/2014 0946   LDLDIRECT 156.5 05/28/2013 0933      Wt Readings from Last 3 Encounters:  07/08/14 173 lb (78.472 kg)  01/21/14 171 lb (77.565 kg)  05/28/13 172 lb (78.019 kg)        ASSESSMENT AND PLAN:  1. essential hypertension without heart failure 2. mild aortic stenosis 3. Hypercholesterolemia  Disposition: Her blood pressure here in the office is slightly high.  At  home her blood pressure is normal. He will continue current medication.  We are checking blood work today.   Current medicines are reviewed at length with the patient today.  The patient does not have concerns regarding medicines.  The following changes have been made:  no change  Labs/ tests ordered today include: Lipid panel hepatic function panel basal metabolic panel   Orders Placed This Encounter  Procedures  . Lipid panel  . Hepatic function panel  . Basic metabolic panel  . Lipid panel  . Hepatic function panel  . Basic metabolic panel     Disposition:   FU with Dr. Patty SermonsBrackbill in 6 months   Signed, Cassell Clementhomas Jadon Harbaugh, MD  07/08/2014 8:42 AM    [facility] [address], [place], [place]  [address] Phone: [phone]; Fax: [phone]) [phone]

## 2014-07-09 NOTE — Progress Notes (Signed)
Quick Note:  Please report to patient. The recent labs are stable. Continue same medication and careful diet.Lipids are better. Kidneys are better. ______

## 2015-02-12 ENCOUNTER — Other Ambulatory Visit: Payer: Self-pay | Admitting: Cardiology

## 2015-02-16 ENCOUNTER — Other Ambulatory Visit: Payer: Self-pay | Admitting: *Deleted

## 2015-02-16 DIAGNOSIS — E7800 Pure hypercholesterolemia, unspecified: Secondary | ICD-10-CM

## 2015-02-16 DIAGNOSIS — I119 Hypertensive heart disease without heart failure: Secondary | ICD-10-CM

## 2015-02-16 DIAGNOSIS — I350 Nonrheumatic aortic (valve) stenosis: Secondary | ICD-10-CM

## 2015-02-16 MED ORDER — OLMESARTAN MEDOXOMIL 40 MG PO TABS: 40.0000 mg | ORAL_TABLET | Freq: Every day | ORAL | Status: DC

## 2015-02-16 NOTE — Telephone Encounter (Signed)
Received fax from [facility] to refill benicar. Rx sent.

## 2015-02-17 ENCOUNTER — Other Ambulatory Visit: Payer: Self-pay | Admitting: *Deleted

## 2015-02-18 ENCOUNTER — Other Ambulatory Visit: Payer: Self-pay

## 2015-02-18 MED ORDER — HYDROCHLOROTHIAZIDE 25 MG PO TABS: 25.0000 mg | ORAL_TABLET | Freq: Every day | ORAL | Status: DC

## 2015-02-19 ENCOUNTER — Other Ambulatory Visit: Payer: Self-pay | Admitting: *Deleted

## 2015-02-19 ENCOUNTER — Telehealth: Payer: Self-pay | Admitting: Cardiology

## 2015-02-19 MED ORDER — HYDROCHLOROTHIAZIDE 25 MG PO TABS: 12.5000 mg | ORAL_TABLET | Freq: Every day | ORAL | Status: DC

## 2015-02-19 NOTE — Telephone Encounter (Signed)
Spoke with [facility] and clarified directions at 1/2 tablet daily Rx changed in September

## 2015-02-19 NOTE — Telephone Encounter (Signed)
New message     Pt c/o medication issue:  1. Name of Medication: HCTZ 2. How are you currently taking this medication (dosage and times per day)?   3. Are you having a reaction (difficulty breathing--STAT)?   4. What is your medication issue? Pharmacy is calling to verify directions.  Please refer to reference # [id number]

## 2015-03-16 ENCOUNTER — Encounter: Payer: Self-pay | Admitting: Cardiology

## 2015-04-05 ENCOUNTER — Ambulatory Visit: Payer: Medicare Other | Admitting: Cardiology

## 2015-04-05 ENCOUNTER — Other Ambulatory Visit: Payer: Medicare Other

## 2015-05-18 ENCOUNTER — Encounter: Payer: Self-pay | Admitting: Cardiology

## 2015-05-20 ENCOUNTER — Other Ambulatory Visit: Payer: Self-pay

## 2015-05-20 DIAGNOSIS — I350 Nonrheumatic aortic (valve) stenosis: Secondary | ICD-10-CM

## 2015-05-20 DIAGNOSIS — E7800 Pure hypercholesterolemia, unspecified: Secondary | ICD-10-CM

## 2015-05-20 DIAGNOSIS — I119 Hypertensive heart disease without heart failure: Secondary | ICD-10-CM

## 2015-05-20 MED ORDER — OLMESARTAN MEDOXOMIL 40 MG PO TABS: 40.0000 mg | ORAL_TABLET | Freq: Every day | ORAL | Status: DC

## 2015-05-20 NOTE — Telephone Encounter (Signed)
Cassell Clement, MD at 07/08/2014 8:10 AM olmesartan (BENICAR) 40 MG tabletTake 1 tablet (40 mg total) by mouth daily Current medicines are reviewed at length with the patient today. The patient does not have concerns regarding medicines.  The following changes have been made: no change

## 2015-05-27 ENCOUNTER — Other Ambulatory Visit (INDEPENDENT_AMBULATORY_CARE_PROVIDER_SITE_OTHER): Payer: 59 | Admitting: *Deleted

## 2015-05-27 ENCOUNTER — Ambulatory Visit (INDEPENDENT_AMBULATORY_CARE_PROVIDER_SITE_OTHER): Payer: 59 | Admitting: Cardiology

## 2015-05-27 ENCOUNTER — Encounter: Payer: Self-pay | Admitting: Cardiology

## 2015-05-27 VITALS — BP 130/64 | HR 64 | Ht 63.0 in | Wt 172.0 lb

## 2015-05-27 DIAGNOSIS — E7800 Pure hypercholesterolemia, unspecified: Secondary | ICD-10-CM

## 2015-05-27 DIAGNOSIS — I119 Hypertensive heart disease without heart failure: Secondary | ICD-10-CM

## 2015-05-27 DIAGNOSIS — Z79899 Other long term (current) drug therapy: Secondary | ICD-10-CM | POA: Diagnosis not present

## 2015-05-27 LAB — HEPATIC FUNCTION PANEL
ALK PHOS: 64 U/L (ref 33–130)
ALT: 11 U/L (ref 6–29)
AST: 16 U/L (ref 10–35)
Albumin: 4 g/dL (ref 3.6–5.1)
BILIRUBIN DIRECT: 0.1 mg/dL (ref <=0.2)
BILIRUBIN INDIRECT: 0.5 mg/dL (ref 0.2–1.2)
BILIRUBIN TOTAL: 0.6 mg/dL (ref 0.2–1.2)
Total Protein: 7.1 g/dL (ref 6.1–8.1)

## 2015-05-27 LAB — BASIC METABOLIC PANEL
BUN: 34 mg/dL — ABNORMAL HIGH (ref 7–25)
CHLORIDE: 100 mmol/L (ref 98–110)
CO2: 25 mmol/L (ref 20–31)
Calcium: 9.3 mg/dL (ref 8.6–10.4)
Creat: 1.49 mg/dL — ABNORMAL HIGH (ref 0.60–0.88)
Glucose, Bld: 92 mg/dL (ref 65–99)
Potassium: 4.3 mmol/L (ref 3.5–5.3)
SODIUM: 140 mmol/L (ref 135–146)

## 2015-05-27 LAB — LIPID PANEL
CHOL/HDL RATIO: 3.9 ratio (ref <=5.0)
CHOLESTEROL: 217 mg/dL — AB (ref 125–200)
HDL: 55 mg/dL (ref >=46)
LDL Cholesterol: 132 mg/dL — ABNORMAL HIGH (ref <130)
Triglycerides: 148 mg/dL (ref <150)
VLDL: 30 mg/dL (ref <30)

## 2015-05-27 NOTE — Patient Instructions (Addendum)
Medication Instructions:  Your physician recommends that you continue on your current medications as directed. Please refer to the Current Medication list given to you today.  Labwork: Lipid profile, CMET  Testing/Procedures: None ordered  Follow-Up: You have been referred to establish cardiology care with Dr Delton See in 6 months.  If you need a refill on your cardiac medications before your next appointment, please call your pharmacy.  Thank you for choosing [facility]!!

## 2015-05-27 NOTE — Progress Notes (Signed)
Cardiology Office Note   Date:  05/27/2015   ID:  Regina Hunter, DOB 16-Jun-1926, MRN [id number]  PCP:  Warren Danes, MD  Cardiologist: Darlin Coco MD  Chief Complaint  Patient presents with  . routine follow up    benign hypertensive heart disease without heart failure      History of Present Illness: Regina Hunter is a 82 y.o. female who presents for a scheduled follow-up visit  . She is seen in followup for essential hypertension. She is not having any side effects from her current regimen of hydrochlorothiazide, metoprolol, and Benicar.  Since we last saw her, her Benicar has gone generic.  I reassured her that the generic works just as well. At her last visit we put her on amlodipine but she developed severe edema. She also felt that she had side effects from the ezetimibe and stopped that. She denies any chest pain or shortness of breath. She previously had developed a cough from ACE inhibitors but tolerating the ARB without difficulty.  She has a known systolic heart murmur. Echocardiogram 08/16/11 showed ejection fraction 55-60% and mild aortic stenosis with a peak gradient of 24 and a mean gradient of 15.  The patient is not having any cardiac symptoms. No chest pain or shortness of breath. 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 She is looking forward to the spring when she get it out and work in her yard.  Past Medical History  Diagnosis Date  . Cataract   . HTN (hypertension)   . Murmur   . Fractured patella     s/p repair by Dr. Tonita Cong  . LVH (left ventricular hypertrophy)     per echo April 2013  . Cardiomegaly     per CXR April 2013    Past Surgical History  Procedure Laterality Date  . Cataract extraction       Current Outpatient Prescriptions  Medication Sig Dispense Refill  . hydrochlorothiazide (HYDRODIURIL) 25 MG tablet Take 0.5 tablets (12.5 mg total) by mouth daily. 45 tablet 1    . metoprolol succinate (TOPROL-XL) 50 MG 24 hr tablet Take 1 tablet (50 mg total) by mouth daily. 90 tablet 3  . olmesartan (BENICAR) 40 MG tablet Take 1 tablet (40 mg total) by mouth daily. 90 tablet 0   No current facility-administered medications for this visit.    Allergies:   Amlodipine and Simvastatin    Social History:  The patient  reports that she has never smoked. She does not have any smokeless tobacco history on file. She reports that she does not drink alcohol or use illicit drugs.   Family History:  The patient's family history includes Heart failure in her mother. There is no history of Anemia, Arrhythmia, Asthma, Clotting disorder, Fainting, Heart attack, Heart disease, Hyperlipidemia, or Hypertension.    ROS:  Please see the history of present illness.   Otherwise, review of systems are positive for none.   All other systems are reviewed and negative.    PHYSICAL EXAM: VS:  BP 130/64 mmHg  Pulse 64  Ht 5' 3" (1.6 m)  Wt 172 lb (78.019 kg)  BMI 30.48 kg/m2 , BMI Body mass index is 30.48 kg/(m^2). GEN: Well nourished, well developed, in no acute distress HEENT: normal Neck: no JVD, carotid bruits, or masses Cardiac: Regular sinus rhythm.  There is a grade 2/6 systolic ejection murmur at the base.  No diastolic murmur.  No gallop  or rub.  No peripheral edema. Respiratory:  clear to auscultation bilaterally, normal work of breathing GI: soft, nontender, nondistended, + BS MS: no deformity or atrophy Skin: warm and dry, no rash Neuro:  Strength and sensation are intact Psych: euthymic mood, full affect   EKG:  EKG is not ordered today.    Recent Labs: 07/08/2014: ALT 13; BUN 32*; Creatinine, Ser 1.37*; Potassium 4.9; Sodium 139    Lipid Panel    Component Value Date/Time   CHOL 220* 07/08/2014 0846   TRIG 123.0 07/08/2014 0846   HDL 60.90 07/08/2014 0846   CHOLHDL 4 07/08/2014 0846   VLDL 24.6 07/08/2014 0846   LDLCALC 135* 07/08/2014 0846   LDLDIRECT  156.5 05/28/2013 0933      Wt Readings from Last 3 Encounters:  05/27/15 172 lb (78.019 kg)  07/08/14 173 lb (78.472 kg)  01/21/14 171 lb (77.565 kg)        ASSESSMENT AND PLAN:  1. essential hypertension without heart failure, well-controlled.  Continue current medication.  2.. Hypercholesterolemia.  Lab work pending. 3.  Mild aortic stenosis, asymptomatic   Current medicines are reviewed at length with the patient today.  The patient does not have concerns regarding medicines.  The following changes have been made:  no change  Labs/ tests ordered today include:   Orders Placed This Encounter  Procedures  . Comp Met (CMET)  . Lipid Profile     Disposition: Continue current medication.  Await today's labs.  Recheck in 6 months for office visit and EKG with Dr. Nelson  Signed, Thomas Brackbill MD 05/27/2015 8:08 AM    [facility] [address], [place], [place]  [address] Phone: [phone]; Fax: [phone]    

## 2015-05-27 NOTE — Addendum Note (Signed)
Addended by: Tonita Phoenix on: 05/27/2015 07:37 AM   Modules accepted: Orders

## 2015-05-27 NOTE — Progress Notes (Signed)
Quick Note:  Please report to patient. The recent labs are stable. Continue same medication and careful diet. Kidneys drier. Drink more water. ______

## 2015-05-27 NOTE — Addendum Note (Signed)
Addended by: Tonita Phoenix on: 05/27/2015 08:14 AM   Modules accepted: Orders

## 2015-05-27 NOTE — Addendum Note (Signed)
Addended by: Tonita Phoenix on: 05/27/2015 07:38 AM   Modules accepted: Orders

## 2015-08-23 ENCOUNTER — Other Ambulatory Visit: Payer: Self-pay

## 2015-08-23 DIAGNOSIS — E7800 Pure hypercholesterolemia, unspecified: Secondary | ICD-10-CM

## 2015-08-23 DIAGNOSIS — I350 Nonrheumatic aortic (valve) stenosis: Secondary | ICD-10-CM

## 2015-08-23 DIAGNOSIS — I119 Hypertensive heart disease without heart failure: Secondary | ICD-10-CM

## 2015-08-23 MED ORDER — OLMESARTAN MEDOXOMIL 40 MG PO TABS: 40.0000 mg | ORAL_TABLET | Freq: Every day | ORAL | Status: DC

## 2015-09-17 ENCOUNTER — Other Ambulatory Visit: Payer: Self-pay | Admitting: Cardiology

## 2015-09-17 MED ORDER — HYDROCHLOROTHIAZIDE 25 MG PO TABS: 12.5000 mg | ORAL_TABLET | Freq: Every day | ORAL | Status: DC

## 2015-11-03 ENCOUNTER — Encounter: Payer: Self-pay | Admitting: *Deleted

## 2015-11-19 ENCOUNTER — Encounter: Payer: Self-pay | Admitting: Cardiology

## 2015-11-19 ENCOUNTER — Ambulatory Visit (INDEPENDENT_AMBULATORY_CARE_PROVIDER_SITE_OTHER): Payer: Medicare Other | Admitting: Cardiology

## 2015-11-19 VITALS — BP 196/78 | HR 74 | Ht 63.0 in | Wt 175.0 lb

## 2015-11-19 DIAGNOSIS — I350 Nonrheumatic aortic (valve) stenosis: Secondary | ICD-10-CM

## 2015-11-19 DIAGNOSIS — I119 Hypertensive heart disease without heart failure: Secondary | ICD-10-CM

## 2015-11-19 DIAGNOSIS — E785 Hyperlipidemia, unspecified: Secondary | ICD-10-CM | POA: Diagnosis not present

## 2015-11-19 DIAGNOSIS — E7800 Pure hypercholesterolemia, unspecified: Secondary | ICD-10-CM | POA: Diagnosis not present

## 2015-11-19 MED ORDER — METOPROLOL SUCCINATE ER 50 MG PO TB24: 50.0000 mg | ORAL_TABLET | Freq: Every day | ORAL | Status: DC

## 2015-11-19 MED ORDER — OLMESARTAN MEDOXOMIL 40 MG PO TABS: 40.0000 mg | ORAL_TABLET | Freq: Every day | ORAL | Status: DC

## 2015-11-19 MED ORDER — HYDROCHLOROTHIAZIDE 25 MG PO TABS: 12.5000 mg | ORAL_TABLET | Freq: Every day | ORAL | Status: DC

## 2015-11-19 NOTE — Progress Notes (Signed)
Cardiology Office Note   Date:  11/19/2015   ID:  Regina Hunter, DOB 10/30/1926, MRN [id number]  PCP:  No PCP Per Patient  Cardiologist: Cassell Clement MD --> Dr. Delton See  Chief complain: 6 months follow-up   History of Present Illness: Regina Hunter is a 82 y.o. female was coming after 6 months, she has been followed by Dr. Patty Sermons for hypertension, hyperlipidemia and aortic stenosis. This is a very delightful patient who states she feels fantastic she looks nonfasting she still working very active and denies any symptoms of chest pain, shortness of breath no palpitations or syncope no dizziness, occasional lower extremity edema that are managed by hydrochlorothiazide. She feels that since her Benicar was switched from name brand to generic form is not so efficient for controlling her blood pressure and lower extremity edema. She is compliant with her meds.  Past Medical History  Diagnosis Date  . Cataract   . HTN (hypertension)   . Murmur   . Fractured patella     s/p repair by Dr. Shelle Iron  . LVH (left ventricular hypertrophy)     per echo April 2013  . Cardiomegaly     per CXR April 2013   Past Surgical History  Procedure Laterality Date  . Cataract extraction     Current Outpatient Prescriptions  Medication Sig Dispense Refill  . hydrochlorothiazide (HYDRODIURIL) 25 MG tablet Take 0.5 tablets (12.5 mg total) by mouth daily. 45 tablet 2  . metoprolol succinate (TOPROL-XL) 50 MG 24 hr tablet Take 1 tablet (50 mg total) by mouth daily. 90 tablet 3  . olmesartan (BENICAR) 40 MG tablet Take 1 tablet (40 mg total) by mouth daily. 90 tablet 0   No current facility-administered medications for this visit.    Allergies:   Amlodipine and Simvastatin    Social History:  The patient  reports that she has never smoked. She does not have any smokeless tobacco history on file. She reports that she does not drink alcohol or use illicit drugs.   Family History:  The patient's  family history includes Heart failure in her mother. There is no history of Anemia, Arrhythmia, Asthma, Clotting disorder, Fainting, Heart attack, Heart disease, Hyperlipidemia, or Hypertension.    ROS:  Please see the history of present illness.   Otherwise, review of systems are positive for none.   All other systems are reviewed and negative.    PHYSICAL EXAM: VS:  BP 196/78 mmHg  Pulse 74  Ht  (1.6 m)  Wt 175 lb (79.379 kg)  BMI 31.01 kg/m2 , BMI Body mass index is 31.01 kg/(m^2). GEN: Well nourished, well developed, in no acute distress HEENT: normal Neck: no JVD, carotid bruits, or masses Cardiac: Regular sinus rhythm.  There is a grade 2/6 systolic ejection murmur at the base.  No diastolic murmur.  No gallop or rub.  No peripheral edema. Respiratory:  clear to auscultation bilaterally, normal work of breathing GI: soft, nontender, nondistended, + BS MS: no deformity or atrophy Skin: warm and dry, no rash Neuro:  Strength and sensation are intact Psych: euthymic mood, full affect  EKG:  EKG is not ordered today.  Recent Labs: 05/27/2015: ALT 11; BUN 34*; Creat 1.49*; Potassium 4.3; Sodium 140   Lipid Panel    Component Value Date/Time   CHOL 217* 05/27/2015 0814   TRIG 148 05/27/2015 0814   HDL 55 05/27/2015 0814   CHOLHDL 3.9 05/27/2015 0814   VLDL 30 05/27/2015 0814   LDLCALC  132* 05/27/2015 0814   LDLDIRECT 156.5 05/28/2013 0933   Wt Readings from Last 3 Encounters:  11/19/15 175 lb (79.379 kg)  05/27/15 172 lb (78.019 kg)  07/08/14 173 lb (78.472 kg)    TTE: 2013 - Left ventricle: The cavity size was normal. Wall thickness was increased in a pattern of mild LVH. Systolic function was normal. The estimated ejection fraction was in the range of 55% to 60%. - Aortic valve: There was mild stenosis. Mean gradient: 15mm Hg (S). Peak gradient: 24mm Hg (S). - Mitral valve: Mild regurgitation. - Left atrium: The atrium was mildly dilated. - Atrial  septum: No defect or patent foramen ovale was identified. - Pulmonary arteries: PA peak pressure: 45mm Hg (S).  ECG: Performed today 11/18/2015 showed Sinus rhythm, nonspecific ST-T wave abnormalities unchanged from prior otherwise normal EKG.   ASSESSMENT AND PLAN:  1. Hypertensive heart disease without heart failure  - recheck blood pressure 170/70 mmHg, however patient hasn't taken her medicines yet and states that at home it's always between 130 to 140 mmHg.  Would continue the same management with Benicar, hydrochlorothiazide and metoprolol however switch back to non-generic form.  2. Aortic stenosis - mild on the echocardiogram in 2013, she is completely asymptomatic still has S2 so no echo is needed right now.  3. Hyperlipidemia with borderline LDL of 132 and high HDL and normal triglycerides. Considering she doesn't have a known coronary artery disease and his 80 year old with great memory I wouldn't push for any cholesterol management.  Current medicines are reviewed at length with the patient today.  The patient does not have concerns regarding medicines.  The following changes have been made:  no change  Labs/ tests ordered today include:   No orders of the defined types were placed in this encounter.    Disposition: Continue current medication.  Await today's labs.  Recheck in 6 months for office visit and EKG with Dr. Delton SeeNelson  Signed, Tobias AlexanderKatarina Dalyce Renne, MD 11/19/2015 9:03 AM    [facility] [address], [place], [place]  [address] Phone: [phone]; Fax: [phone]

## 2015-11-19 NOTE — Patient Instructions (Signed)
Medication Instructions:  1. REFILLS HAVE BEEN SENT TO [facility] : METOPROLOL, BENICAR, HCTZ; THESE HAVE BEEN SENT AS BRAND NAME PER YOUR REQUEST  Labwork: 05/22/16 YOU WILL NEED FASTING CHOLESTEROL PANEL, CBC W/DIFF, TSH   Testing/Procedures: NONE  Follow-Up: Your physician wants you to follow-up in: 6 MONTHS WITH DR. Delton SeeNELSON.  You will receive a reminder letter in the mail two months in advance. If you don't receive a letter, please call our office to schedule the follow-up appointment.   Any Other Special Instructions Will Be Listed Below (If Applicable).     If you need a refill on your cardiac medications before your next appointment, please call your pharmacy.

## 2016-05-22 ENCOUNTER — Other Ambulatory Visit: Payer: Medicare Other | Admitting: *Deleted

## 2016-05-22 DIAGNOSIS — R9431 Abnormal electrocardiogram [ECG] [EKG]: Secondary | ICD-10-CM

## 2016-05-22 DIAGNOSIS — I119 Hypertensive heart disease without heart failure: Secondary | ICD-10-CM

## 2016-05-22 DIAGNOSIS — E7800 Pure hypercholesterolemia, unspecified: Secondary | ICD-10-CM

## 2016-05-22 DIAGNOSIS — I350 Nonrheumatic aortic (valve) stenosis: Secondary | ICD-10-CM

## 2016-05-22 NOTE — Addendum Note (Signed)
Addended by: Tonita PhoenixBOWDEN, ROBIN K on: 05/22/2016 08:05 AM   Modules accepted: Orders

## 2016-05-23 LAB — BASIC METABOLIC PANEL
BUN/Creatinine Ratio: 31 — ABNORMAL HIGH (ref 12–28)
BUN: 40 mg/dL — ABNORMAL HIGH (ref 8–27)
CO2: 26 mmol/L (ref 18–29)
Calcium: 9.9 mg/dL (ref 8.7–10.3)
Chloride: 97 mmol/L (ref 96–106)
Creatinine, Ser: 1.28 mg/dL — ABNORMAL HIGH (ref 0.57–1.00)
GFR calc Af Amer: 43 mL/min/{1.73_m2} — ABNORMAL LOW (ref >59)
GFR calc non Af Amer: 38 mL/min/{1.73_m2} — ABNORMAL LOW (ref >59)
Glucose: 97 mg/dL (ref 65–99)
Potassium: 5.1 mmol/L (ref 3.5–5.2)
Sodium: 144 mmol/L (ref 134–144)

## 2016-05-23 LAB — CBC WITH DIFFERENTIAL/PLATELET
Basophils Absolute: 0 10*3/uL (ref 0.0–0.2)
Basos: 0 %
EOS (ABSOLUTE): 0.2 10*3/uL (ref 0.0–0.4)
Eos: 3 %
Hematocrit: 39.2 % (ref 34.0–46.6)
Hemoglobin: 12.8 g/dL (ref 11.1–15.9)
Immature Grans (Abs): 0 10*3/uL (ref 0.0–0.1)
Immature Granulocytes: 0 %
Lymphocytes Absolute: 2 10*3/uL (ref 0.7–3.1)
Lymphs: 26 %
MCH: 29 pg (ref 26.6–33.0)
MCHC: 32.7 g/dL (ref 31.5–35.7)
MCV: 89 fL (ref 79–97)
Monocytes Absolute: 0.6 10*3/uL (ref 0.1–0.9)
Monocytes: 8 %
Neutrophils Absolute: 4.9 10*3/uL (ref 1.4–7.0)
Neutrophils: 63 %
Platelets: 289 10*3/uL (ref 150–379)
RBC: 4.42 x10E6/uL (ref 3.77–5.28)
RDW: 13.4 % (ref 12.3–15.4)
WBC: 7.8 10*3/uL (ref 3.4–10.8)

## 2016-05-23 LAB — HEPATIC FUNCTION PANEL
ALT: 11 IU/L (ref 0–32)
AST: 17 IU/L (ref 0–40)
Albumin: 4.4 g/dL (ref 3.5–4.7)
Alkaline Phosphatase: 75 IU/L (ref 39–117)
Bilirubin Total: 0.7 mg/dL (ref 0.0–1.2)
Bilirubin, Direct: 0.17 mg/dL (ref 0.00–0.40)
Total Protein: 7.2 g/dL (ref 6.0–8.5)

## 2016-05-23 LAB — LIPID PANEL
Chol/HDL Ratio: 4 ratio units (ref 0.0–4.4)
Cholesterol, Total: 238 mg/dL — ABNORMAL HIGH (ref 100–199)
HDL: 60 mg/dL (ref >39)
LDL Calculated: 149 mg/dL — ABNORMAL HIGH (ref 0–99)
Triglycerides: 144 mg/dL (ref 0–149)
VLDL Cholesterol Cal: 29 mg/dL (ref 5–40)

## 2016-05-23 LAB — TSH: TSH: 2.23 u[IU]/mL (ref 0.450–4.500)

## 2016-06-22 ENCOUNTER — Ambulatory Visit (INDEPENDENT_AMBULATORY_CARE_PROVIDER_SITE_OTHER): Payer: Medicare Other | Admitting: Cardiology

## 2016-06-22 ENCOUNTER — Encounter: Payer: Self-pay | Admitting: Cardiology

## 2016-06-22 VITALS — BP 122/64 | HR 60 | Ht 63.0 in | Wt 177.0 lb

## 2016-06-22 DIAGNOSIS — I350 Nonrheumatic aortic (valve) stenosis: Secondary | ICD-10-CM | POA: Diagnosis not present

## 2016-06-22 DIAGNOSIS — E782 Mixed hyperlipidemia: Secondary | ICD-10-CM | POA: Diagnosis not present

## 2016-06-22 DIAGNOSIS — I119 Hypertensive heart disease without heart failure: Secondary | ICD-10-CM

## 2016-06-22 NOTE — Patient Instructions (Signed)

## 2016-06-22 NOTE — Progress Notes (Signed)
Cardiology Office Note   Date:  06/22/2016   ID:  Regina MerlesHelen Markwell, DOB [birth year], MRN [id number]  PCP:  No PCP Per Patient  Cardiologist: Cassell Clementhomas Brackbill MD --> Dr. Delton SeeNelson  Chief complain: 6 months follow-up   History of Present Illness: Regina MerlesHelen Hunter is a 83 y.o. female was coming after 6 months, she has been followed by Dr. Patty SermonsBrackbill for hypertension, hyperlipidemia and aortic stenosis. This is a very delightful patient who states she feels fantastic she looks nonfasting she still working very active and denies any symptoms of chest pain, shortness of breath no palpitations or syncope no dizziness, occasional lower extremity edema that are managed by hydrochlorothiazide. She feels that since her Benicar was switched from name brand to generic form is not so efficient for controlling her blood pressure and lower extremity edema. She is compliant with her meds.  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 She denies any chest pain, shortness of breath, palpitations, lower extremity edema and no dizziness. She's been compliant with her medications. She walks several flight of stairs a day and hasn't noticed any change in her symptoms.  Past Medical History:  Diagnosis Date  . Cardiomegaly    per CXR April 2013  . Cataract   . Fractured patella    s/p repair by Dr. Shelle IronBeane  . HTN (hypertension)   . LVH (left ventricular hypertrophy)    per echo April 2013  . Murmur    Past Surgical History:  Procedure Laterality Date  . CATARACT EXTRACTION     Current Outpatient Prescriptions  Medication Sig Dispense Refill  . hydrochlorothiazide (HYDRODIURIL) 25 MG tablet Take 0.5 tablets (12.5 mg total) by mouth daily. 90 tablet 3  . metoprolol succinate (TOPROL-XL) 50 MG 24 hr tablet Take 1 tablet (50 mg total) by mouth daily. 90 tablet 3  . olmesartan (BENICAR) 40 MG tablet Take 1 tablet (40 mg total) by mouth  daily. 90 tablet 3   No current facility-administered medications for this visit.     Allergies:   Amlodipine and Simvastatin    Social History:  The patient  reports that she has never smoked. She has never used smokeless tobacco. She reports that she does not drink alcohol or use drugs.   Family History:  The patient's family history includes Heart failure in her mother.   ROS:  Please see the history of present illness.   Otherwise, review of systems are positive for none.   All other systems are reviewed and negative.   PHYSICAL EXAM: VS:  BP 122/64   Pulse 60   Ht 5\' 3"  (1.6 m)   Wt 177 lb (80.3 kg)   BMI 31.35 kg/m  , BMI Body mass index is 31.35 kg/m. GEN: Well nourished, well developed, in no acute distress  HEENT: normal  Neck: no JVD, carotid bruits, or masses Cardiac: Regular sinus rhythm.  There is a grade 2/6 systolic ejection murmur at the base.  No diastolic murmur.  No gallop or rub.  No peripheral edema. Respiratory:  clear to auscultation bilaterally, normal work of breathing GI: soft, nontender, nondistended, + BS MS: no deformity or atrophy  Skin: warm and dry, no rash Neuro:  Strength and sensation are intact Psych: euthymic mood, full affect  EKG:  EKG is not ordered today.  Recent Labs: 05/22/2016: ALT 11; BUN 40; Creatinine, Ser 1.28; Platelets 289; Potassium 5.1; Sodium 144;  TSH 2.230   Lipid Panel    Component Value Date/Time   CHOL 238 (H) 05/22/2016 0805   TRIG 144 05/22/2016 0805   HDL 60 05/22/2016 0805   CHOLHDL 4.0 05/22/2016 0805   CHOLHDL 3.9 05/27/2015 0814   VLDL 30 05/27/2015 0814   LDLCALC 149 (H) 05/22/2016 0805   LDLDIRECT 156.5 05/28/2013 0933   Wt Readings from Last 3 Encounters:  06/22/16 177 lb (80.3 kg)  11/19/15 175 lb (79.4 kg)  05/27/15 172 lb (78 kg)    TTE: 2013 - Left ventricle: The cavity size was normal. Wall thickness was increased in a pattern of mild LVH. Systolic function was normal. The estimated  ejection fraction was in the range of 55% to 60%. - Aortic valve: There was mild stenosis. Mean gradient: 15mm Hg (S). Peak gradient: 24mm Hg (S). - Mitral valve: Mild regurgitation. - Left atrium: The atrium was mildly dilated. - Atrial septum: No defect or patent foramen ovale was identified. - Pulmonary arteries: PA peak pressure: 45mm Hg (S).  ECG: Performed today 11/18/2015 showed Sinus rhythm, nonspecific ST-T wave abnormalities unchanged from prior otherwise normal EKG.   ASSESSMENT AND PLAN:  1. Hypertensive heart disease without heart failure  - controlled on current regimen.  2. Aortic stenosis - mild on the echocardiogram in 2013, asymptomatic still has S2 so no echo is needed right now. ECG is unchanged.  3. Hyperlipidemia with borderline LDL of 149 and high HDL and normal triglycerides. Considering she doesn't have a known coronary artery disease and his 81 year old with great memory I wouldn't push for any cholesterol management.   Current medicines are reviewed at length with the patient today.  The patient does not have concerns regarding medicines.  The following changes have been made:  no change  Labs/ tests ordered today include:   Orders Placed This Encounter  Procedures  . EKG 12-Lead    Disposition: Continue current medication.  Await today's labs.  Recheck in 6 months for office visit and EKG with Dr. Delton See  Signed, Tobias Alexander, MD 06/22/2016 8:43 AM    [facility] [address] [place], [place], [place]  [address] Phone: [phone]; Fax: [phone]

## 2016-10-01 ENCOUNTER — Other Ambulatory Visit: Payer: Self-pay | Admitting: Cardiology

## 2016-10-01 DIAGNOSIS — E7800 Pure hypercholesterolemia, unspecified: Secondary | ICD-10-CM

## 2016-10-01 DIAGNOSIS — I350 Nonrheumatic aortic (valve) stenosis: Secondary | ICD-10-CM

## 2016-10-01 DIAGNOSIS — I119 Hypertensive heart disease without heart failure: Secondary | ICD-10-CM

## 2017-01-19 ENCOUNTER — Other Ambulatory Visit: Payer: Self-pay | Admitting: Cardiology

## 2017-01-19 DIAGNOSIS — I119 Hypertensive heart disease without heart failure: Secondary | ICD-10-CM

## 2017-02-22 ENCOUNTER — Encounter: Payer: Self-pay | Admitting: Cardiology

## 2017-03-02 ENCOUNTER — Encounter: Payer: Self-pay | Admitting: Physician Assistant

## 2017-03-13 ENCOUNTER — Encounter: Payer: Self-pay | Admitting: Physician Assistant

## 2017-03-13 NOTE — Progress Notes (Signed)
Cardiology Office Note    Date:  03/15/2017  ID:  Regina Hunter, DOB May 22, 1926, MRN [id number] PCP:  Patient, No Pcp Per  Cardiologist:  Dr. Delton See   Chief Complaint: f/u hypertensive heart disease and aortic stenosis  History of Present Illness:  Regina Hunter is a 84 y.o. female with history of HTN, hyperlipidemia, aortic stenosis, hypertensive heart disease, probable CKD stage III per labs who presents for one year follow-up. She has history of lower extremity edema after switching Benicar to generic form. Otherwise heart history has been fairly unremarkable. 2D echo 07/2011: mild LVH, EF 55-60%, mild aortic stenosis, mild mitral regurgitation, mild LAE. Last labs 05/2016 showed LDL 149, LFTs wnl, TSH wnl, K 5.1, Cr 1.28 (previous baseline 1.3-1.5), CBC wnl.  She returns for follow-up alone today. Burgess Estelle was her 89th birthday and she reports she's been "misbehaving" with her diet and really indulging in higher sodium foods the last few days. No CP. She has chronic DOE with higher levels of exertion such as parking far away at church when the parking lot is full (has to park in the lot across the street), thus requests a renewal on her handicapped placard "just in case." She states she tries not to use this unless necessary. 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 No LEE, palpitations, syncope. She has been following her pressure at home and has tended to see readings of 140-145 systolic. Repeat pressure by me was 180/85. She jokes this might be because of the nice young man that checked her in.   Past Medical History:  Diagnosis Date  . Cardiomegaly    per CXR April 2013  . Cataract   . CKD (chronic kidney disease), stage III (HCC)   . Fractured patella    s/p repair by Dr. Shelle Iron  . HTN (hypertension)   . Hyperlipidemia   . Hypertensive heart disease without heart failure   . LVH (left ventricular hypertrophy)      per echo April 2013  . Murmur     Past Surgical History:  Procedure Laterality Date  . CATARACT EXTRACTION      Current Medications: Current Meds  Medication Sig  . BENICAR 40 MG tablet TAKE 1 TABLET BY MOUTH  DAILY  . hydrochlorothiazide (HYDRODIURIL) 25 MG tablet TAKE ONE-HALF TABLET BY  MOUTH DAILY  . TOPROL XL 50 MG 24 hr tablet TAKE 1 TABLET BY MOUTH  DAILY     Allergies:   Amlodipine and Simvastatin   Social History   Socioeconomic History  . Marital status: Married    Spouse name: None  . Number of children: None  . Years of education: None  . Highest education level: None  Social Needs  . Financial resource strain: None  . Food insecurity - worry: None  . Food insecurity - inability: None  . Transportation needs - medical: None  . Transportation needs - non-medical: None  Occupational History  . None  Tobacco Use  . Smoking status: Never Smoker  . Smokeless tobacco: Never Used  Substance and Sexual Activity  . Alcohol use: No  . Drug use: No  . Sexual activity: Not Currently  Other Topics Concern  . None  Social History Narrative  . None     Family History:  Family History  Problem Relation Age of Onset  . Heart failure Mother        CAUSE OF DEAT  . Anemia Neg Hx   .  Arrhythmia Neg Hx   . Asthma Neg Hx   . Clotting disorder Neg Hx   . Fainting Neg Hx   . Heart attack Neg Hx   . Heart disease Neg Hx   . Hyperlipidemia Neg Hx   . Hypertension Neg Hx     ROS:   Please see the history of present illness.  All other systems are reviewed and otherwise negative.    PHYSICAL EXAM:   VS:  BP (!) 160/78   Pulse 71   Resp 16   Ht 5\' 3"  (1.6 m)   Wt 180 lb 12.8 oz (82 kg)   SpO2 98%   BMI 32.03 kg/m   BMI: Body mass index is 32.03 kg/m. GEN: Well nourished, well developed lively WF, in no acute distress  HEENT: normocephalic, atraumatic Neck: no JVD, carotid bruits, or masses Cardiac:RRR; very soft SEM with preserved S2. No rubs or  gallops, no edema  Respiratory:  clear to auscultation bilaterally, normal work of breathing GI: soft, nontender, nondistended, + BS MS: no deformity or atrophy  Skin: warm and dry, no rash Neuro:  Alert and Oriented x 3, Strength and sensation are intact, follows commands Psych: euthymic mood, full affect  Wt Readings from Last 3 Encounters:  03/15/17 180 lb 12.8 oz (82 kg)  06/22/16 177 lb (80.3 kg)  11/19/15 175 lb (79.4 kg)      Studies/Labs Reviewed:   EKG:  EKG was ordered today and personally reviewed by me and demonstrates NSR 64bpm, nonspecific ST-T changes similar to 06/2016 Recent Labs: 05/22/2016: ALT 11; BUN 40; Creatinine, Ser 1.28; Hemoglobin 12.8; Platelets 289; Potassium 5.1; Sodium 144; TSH 2.230   Lipid Panel    Component Value Date/Time   CHOL 238 (H) 05/22/2016 0805   TRIG 144 05/22/2016 0805   HDL 60 05/22/2016 0805   CHOLHDL 4.0 05/22/2016 0805   CHOLHDL 3.9 05/27/2015 0814   VLDL 30 05/27/2015 0814   LDLCALC 149 (H) 05/22/2016 0805   LDLDIRECT 156.5 05/28/2013 0933    Additional studies/ records that were reviewed today include: Summarized above    ASSESSMENT & PLAN:   1. Hypertensive heart disease without heart failure - ideally would like to see lower blood pressure but the patient is quite hesitant to "rock the boat." She cites that she's been told in the past she may never have a normal blood pressure and since she is feeling well, she does not wish to make any changes today. She is aware of the need to reduce sodium in diet but admits it's hard this time of year with her birthday, Thanksgiving and Christmas. See below regarding HTN. She has no signs of CHF on exam. Will update labs today including CBC, BMET, and TSH. 2. Essential HTN - update labs. She does have a widened pulse pressure on recheck BP so I would like to update her echo to get a sense for her aortic valve structure. We discussed adjusting her medication today but she is quite adamant  that she wishes to leave everything the same and try to work on dietary compliance with sodium restriction. I requested she get a blood pressure check visit with our pharmD HTN clinic at time of her echo and she is agreeable. Note she reports prior h/o "drying out" with increased diuretics. Prior K was 5.1 so spironolactone is not ideal choice either. Will need to follow BP for further consideration. I also asked her to monitor at home and call in the interim if  continuing to run high, greater than her usual 140-145 readings. 3. Hyperlipidemia - she expresses desire to avoid medication or treatment for this, so will not recheck at this time. 4. Aortic stenosis - as above, update echo. Minimal murmur on exam but widened pulse pressure is noted.  Disposition: F/u with HTN clinic in approximately 1 week; Dr. Delton SeeNelson in 1 year.   Medication Adjustments/Labs and Tests Ordered: Current medicines are reviewed at length with the patient today.  Concerns regarding medicines are outlined above. Medication changes, Labs and Tests ordered today are summarized above and listed in the Patient Instructions accessible in Encounters.   Signed, Laurann Montanaayna N Keanu Lesniak, PA-C  03/15/2017 8:56 AM    Regina Hunter [address], [place], [place]  [address] Phone: [phone]) [phone]; Fax: [phone]

## 2017-03-14 ENCOUNTER — Ambulatory Visit: Payer: Medicare Other | Admitting: Cardiology

## 2017-03-15 ENCOUNTER — Ambulatory Visit: Payer: Medicare Other | Admitting: Physician Assistant

## 2017-03-15 ENCOUNTER — Encounter: Payer: Self-pay | Admitting: Physician Assistant

## 2017-03-15 VITALS — BP 160/78 | HR 71 | Resp 16 | Ht 63.0 in | Wt 180.8 lb

## 2017-03-15 DIAGNOSIS — I119 Hypertensive heart disease without heart failure: Secondary | ICD-10-CM

## 2017-03-15 DIAGNOSIS — E7800 Pure hypercholesterolemia, unspecified: Secondary | ICD-10-CM | POA: Diagnosis not present

## 2017-03-15 DIAGNOSIS — I10 Essential (primary) hypertension: Secondary | ICD-10-CM

## 2017-03-15 DIAGNOSIS — I350 Nonrheumatic aortic (valve) stenosis: Secondary | ICD-10-CM

## 2017-03-15 NOTE — Patient Instructions (Signed)
Medication Instructions: Your physician recommends that you continue on your current medications as directed. Please refer to the Current Medication list given to you today.  Labwork: Your physician has recommended that you have lab work today: BMET, CBC, and TSH  Procedures/Testing: Your physician has requested that you have an echocardiogram. Echocardiography is a painless test that uses sound waves to create images of your heart. It provides your doctor with information about the size and shape of your heart and how well your heart's chambers and valves are working. This procedure takes approximately one hour. There are no restrictions for this procedure.   Follow-Up: Your physician recommends that you schedule a follow-up appointment in: 1 WEEK with the Hypertension Clinic - Preferably same day as echo appt.  Your physician wants you to follow-up in: 1 YEAR with Dr. Delton SeeNelson.  You will receive a reminder letter in the mail two months in advance. If you don't receive a letter, please call our office to schedule the follow-up appointment.   If you need a refill on your cardiac medications before your next appointment, please call your pharmacy.

## 2017-03-27 ENCOUNTER — Telehealth (HOSPITAL_COMMUNITY): Payer: Self-pay | Admitting: Physician Assistant

## 2017-03-27 NOTE — Telephone Encounter (Signed)
User: Regina Hunter, Harm Jou A Date/time: 03/27/17 8:06 AM  Comment: Called pt and lmsg for her to CB to move the time for echo appt on 12/3.   Context:  Outcome: Left Message  Phone number: [phone] Phone Type: Home Phone  Comm. type: Telephone Call type: Outgoing  Contact: Picariello, Arine Relation to patient: Self

## 2017-04-02 ENCOUNTER — Ambulatory Visit (HOSPITAL_COMMUNITY): Payer: Medicare Other | Attending: Internal Medicine

## 2017-04-02 ENCOUNTER — Other Ambulatory Visit: Payer: Self-pay

## 2017-04-02 ENCOUNTER — Ambulatory Visit (INDEPENDENT_AMBULATORY_CARE_PROVIDER_SITE_OTHER): Payer: Medicare Other | Admitting: Pharmacist

## 2017-04-02 VITALS — BP 138/78 | HR 64

## 2017-04-02 DIAGNOSIS — I350 Nonrheumatic aortic (valve) stenosis: Secondary | ICD-10-CM | POA: Insufficient documentation

## 2017-04-02 DIAGNOSIS — I119 Hypertensive heart disease without heart failure: Secondary | ICD-10-CM

## 2017-04-02 DIAGNOSIS — I10 Essential (primary) hypertension: Secondary | ICD-10-CM | POA: Diagnosis not present

## 2017-04-02 DIAGNOSIS — R0600 Dyspnea, unspecified: Secondary | ICD-10-CM | POA: Insufficient documentation

## 2017-04-02 DIAGNOSIS — E785 Hyperlipidemia, unspecified: Secondary | ICD-10-CM | POA: Insufficient documentation

## 2017-04-02 NOTE — Progress Notes (Signed)
I agree with the assessment and plan as documented above.   Thank you, Freddie ApleyKelley M. Cleatis PolkaAuten, PharmD, BCPS [facility]  [address], [place], [place] [address]  Phone: [phone]; Fax: [phone] 04/02/2017 4:37 PM

## 2017-04-02 NOTE — Progress Notes (Signed)
Patient ID: Regina MerlesHelen Hunter                 DOB: April 11, 1927                      MRN: [id number]     HPI: Regina Hunter is a 83 y.o. female patient of Dr. Delton SeeNelson referred to HTN clinic by Ronie Spiesayna Dunn, PA. PMH is significant for HTN, HLD, aortic stenosis, hypertensive heart disease, and probable CKD stage III. Last ECHO 07/2011 mild LVH, EF 55-60%. Had been indulging in high sodium foods for her 89th birthday at last clinic visit on 03/15/17 and had elevated BP at 160/78. Patient was adamant about keeping medications the same at this visit and stated that she would work on her diet.  Patient arrives to clinic in good spirits today after her ECHO. Patient brought in all her medications today and I reviewed them with her. Patient reports adherence with all of her medications. She states that she has been with the cardiologist long enough to know what to do to get her BP back down, so she has gone back to her normal diet.   Current HTN meds: Benicar 40mg  daily, HCTZ 25mg  1/2 tablet daily, Toprol XL 50mg  daily Previously tried: olmesartan (LEE-with generic only), diuretics (h/o "drying out"), amlodipine (LEE) BP goal: < 140/80 mmHg  Family History: Heart failure (cause of death) and HTN in mother. HTN in sister and daughter.   Social History: Never smoker or used smokeless tobacco. Reports an occasional glass of wine on the weekends if she goes out to eat. Denies illicit drug use.  Diet:   Breakfast: not a breakfast eater - one 8oz cup of coffee and fig newtons to take her meds Lunch: Chicken dish from [employer], weakness is fries from [facility] [facility] [employer] [employer], today she had a chicken skewer from [employer] with her salad Dinner: Eats a lot of salads with grilled chicken from [employer] salad [employer].  Mostly eats out as she lives alone. Doesn't eat red meat. Doesn't add salt to her food. Drinks: 1 cup of coffee and 1 glass of tea per day, water at night  Exercise: Can't stoop or squat and this limits her  exercise. Walks 20-5130min 2 days a week.  Home BP readings: Takes every day at home: 140-145/80 mmHg  Labs: 05/22/16: Scr 1.28, K 5.1  Wt Readings from Last 3 Encounters:  03/15/17 180 lb 12.8 oz (82 kg)  06/22/16 177 lb (80.3 kg)  11/19/15 175 lb (79.4 kg)   BP Readings from Last 3 Encounters:  03/15/17 (!) 160/78  06/22/16 122/64  11/19/15 (!) 196/78   Pulse Readings from Last 3 Encounters:  03/15/17 71  06/22/16 60  11/19/15 74    Renal function: CrCl cannot be calculated (Patient's most recent lab result is older than the maximum 21 days allowed.).  Past Medical History:  Diagnosis Date  . Cardiomegaly    per CXR April 2013  . Cataract   . CKD (chronic kidney disease), stage III (HCC)   . Fractured patella    s/p repair by Dr. Shelle IronBeane  . HTN (hypertension)   . Hyperlipidemia   . Hypertensive heart disease without heart failure   . LVH (left ventricular hypertrophy)    per echo April 2013  . Murmur     Current Outpatient Medications on File Prior to Visit  Medication Sig Dispense Refill  . BENICAR 40 MG tablet TAKE 1 TABLET BY MOUTH  DAILY 90  tablet 2  . hydrochlorothiazide (HYDRODIURIL) 25 MG tablet TAKE ONE-HALF TABLET BY  MOUTH DAILY 45 tablet 1  . TOPROL XL 50 MG 24 hr tablet TAKE 1 TABLET BY MOUTH  DAILY 90 tablet 2   No current facility-administered medications on file prior to visit.     Allergies  Allergen Reactions  . Amlodipine     Peripheral edema  . Simvastatin     Fatigue, swelling, and muscle pain     Assessment/Plan:  1. Hypertension - Patient's BP is 138/78 today in clinic, which is at her goal of < 140/80 mmHg. PA wanted updated labs on 11/15 but they weren't drawn, will have labs drawn today. Continue Benicar, HCTZ, and metoprolol. Follow up as needed for elevated or low BP with Dr. Delton SeeNelson as recommended.  Patient seen with Josiah LoboBrittany Gracy Ehly, PharmD Candidate

## 2017-04-02 NOTE — Patient Instructions (Signed)
It was great to see you today!  Your blood pressure today looked great!  Continue Benicar, HCTZ, and metoprolol.   Follow up as needed for blood pressure > 150/80 or < 110/70. Call [phone] with any questions or concerns.

## 2017-04-03 ENCOUNTER — Telehealth: Payer: Self-pay

## 2017-04-03 LAB — CBC WITH DIFFERENTIAL/PLATELET
Basophils Absolute: 0 10*3/uL (ref 0.0–0.2)
Basos: 0 %
EOS (ABSOLUTE): 0.3 10*3/uL (ref 0.0–0.4)
Eos: 3 %
Hematocrit: 38 % (ref 34.0–46.6)
Hemoglobin: 12.3 g/dL (ref 11.1–15.9)
IMMATURE GRANS (ABS): 0 10*3/uL (ref 0.0–0.1)
Immature Granulocytes: 0 %
LYMPHS: 24 %
Lymphocytes Absolute: 2.1 10*3/uL (ref 0.7–3.1)
MCH: 29.3 pg (ref 26.6–33.0)
MCHC: 32.4 g/dL (ref 31.5–35.7)
MCV: 91 fL (ref 79–97)
MONOCYTES: 10 %
Monocytes Absolute: 0.9 10*3/uL (ref 0.1–0.9)
NEUTROS PCT: 63 %
Neutrophils Absolute: 5.4 10*3/uL (ref 1.4–7.0)
PLATELETS: 272 10*3/uL (ref 150–379)
RBC: 4.2 x10E6/uL (ref 3.77–5.28)
RDW: 13.4 % (ref 12.3–15.4)
WBC: 8.7 10*3/uL (ref 3.4–10.8)

## 2017-04-03 LAB — ECHOCARDIOGRAM COMPLETE
AO mean calculated velocity dopler: 170 cm/s
AOPV: 0.4 m/s
AOVTI: 61.7 cm
AV Area VTI: 0.91 cm2
AV VEL mean LVOT/AV: 0.48
AV area mean vel ind: 0.56 cm2/m2
AV vel: 1.1
AVA: 1.1 cm2
AVAREAMEANV: 1.09 cm2
AVAREAVTIIND: 0.57 cm2/m2
AVG: 13 mmHg
AVPG: 27 mmHg
AVPKVEL: 262 cm/s
Ao-asc: 36 cm
CHL CUP AV PEAK INDEX: 0.47
CHL CUP AV VALUE AREA INDEX: 0.57
CHL CUP DOP CALC LVOT VTI: 30 cm
CHL CUP RV SYS PRESS: 32 mmHg
CHL CUP TV REG PEAK VELOCITY: 268 cm/s
EERAT: 11.85
EWDT: 303 ms
FS: 40 % (ref 28–44)
IVS/LV PW RATIO, ED: 1.02
LA ID, A-P, ES: 38 mm
LA vol: 56 mL
LADIAMINDEX: 1.96 cm/m2
LAVOLA4C: 49 mL
LAVOLIN: 28.9 mL/m2
LEFT ATRIUM END SYS DIAM: 38 mm
LV E/e' medial: 11.85
LV PW d: 11.2 mm — AB (ref 0.6–1.1)
LV TDI E'MEDIAL: 6.43
LVEEAVG: 11.85
LVELAT: 8.29 cm/s
LVOT area: 2.27 cm2
LVOT diameter: 17 mm
LVOT peak VTI: 0.49 cm
LVOT peak grad rest: 4 mmHg
LVOTPV: 105 cm/s
LVOTSV: 68 mL
Lateral S' vel: 17.2 cm/s
MV Dec: 303
MV pk A vel: 116 m/s
MV pk E vel: 98.2 m/s
MVPG: 4 mmHg
TDI e' lateral: 8.29
TRMAXVEL: 268 cm/s

## 2017-04-03 LAB — BASIC METABOLIC PANEL
BUN/Creatinine Ratio: 29 — ABNORMAL HIGH (ref 12–28)
BUN: 47 mg/dL — ABNORMAL HIGH (ref 8–27)
CALCIUM: 9.2 mg/dL (ref 8.7–10.3)
CHLORIDE: 102 mmol/L (ref 96–106)
CO2: 26 mmol/L (ref 20–29)
Creatinine, Ser: 1.63 mg/dL — ABNORMAL HIGH (ref 0.57–1.00)
GFR calc non Af Amer: 28 mL/min/{1.73_m2} — ABNORMAL LOW (ref >59)
GFR, EST AFRICAN AMERICAN: 32 mL/min/{1.73_m2} — AB (ref >59)
Glucose: 105 mg/dL — ABNORMAL HIGH (ref 65–99)
Potassium: 4.3 mmol/L (ref 3.5–5.2)
Sodium: 144 mmol/L (ref 134–144)

## 2017-04-03 LAB — TSH: TSH: 1.48 u[IU]/mL (ref 0.450–4.500)

## 2017-04-03 MED ORDER — AMLODIPINE BESYLATE 2.5 MG PO TABS: 2.5000 mg | ORAL_TABLET | Freq: Every day | ORAL | 11 refills | Status: DC

## 2017-04-03 NOTE — Telephone Encounter (Signed)
Informed patient of results and verbal understanding expressed.  Instructed patient to: 1) STOP HCTZ 2) START AMLODIPINE 2.5 mg daily 3) call her PCP to arrange OV in 1 week with repeat blood work and BP She understands she will be called once Dr. Tenny Crawoss gives her input on ECHO. She was grateful for call and agrees with treatment plan.

## 2017-04-03 NOTE — Telephone Encounter (Signed)
-----   Message from Laurann Montanaayna N Dunn, [place]C sent at 04/03/2017  4:27 PM EST ----- Please let patient know her labs show she's dried out. Would recommend to stop HCTZ and start amlodipine 2.5mg  daily in its place. She had prior edema on amlodipine but this was with higher dose, so will try lower dose. Awaiting input from Dr. Tenny Crawoss on her final echo interpretation but preliminary read shows strong heart, but some stiffness (common in high BP). I wanted Dr. Tenny Crawoss to comment on how aortic valve flows.  Given this rise in creatinine, I would advise her to follow up with her primary care in 1 week for recheck labs and BP.  Dayna Dunn PA-C

## 2017-06-23 ENCOUNTER — Other Ambulatory Visit: Payer: Self-pay | Admitting: Cardiology

## 2017-07-05 ENCOUNTER — Telehealth: Payer: Self-pay | Admitting: Cardiology

## 2017-07-05 DIAGNOSIS — I119 Hypertensive heart disease without heart failure: Secondary | ICD-10-CM

## 2017-07-05 DIAGNOSIS — E7800 Pure hypercholesterolemia, unspecified: Secondary | ICD-10-CM

## 2017-07-05 DIAGNOSIS — I350 Nonrheumatic aortic (valve) stenosis: Secondary | ICD-10-CM

## 2017-07-05 MED ORDER — METOPROLOL SUCCINATE ER 50 MG PO TB24: 50.0000 mg | ORAL_TABLET | Freq: Every day | ORAL | 2 refills | Status: DC

## 2017-07-05 MED ORDER — OLMESARTAN MEDOXOMIL 40 MG PO TABS: 40.0000 mg | ORAL_TABLET | Freq: Every day | ORAL | 2 refills | Status: DC

## 2017-07-05 NOTE — Telephone Encounter (Signed)
Notified the pt that per Dr Delton SeeNelson, she recommends that she discontinue HCTZ and amlodipine, and start check her BP daily, for she may not need any of those.  Advised the pt to call back if her BP increases.  Pt verbalized understanding and agrees with this plan.

## 2017-07-05 NOTE — Telephone Encounter (Signed)
Please have her discontinue HCTZ and amlodipine and start checking her blood pressures daily, she might not need any of those.

## 2017-07-05 NOTE — Telephone Encounter (Signed)
Dr. Delton SeeNelson, please review this message.  Pts HCTZ was discontinued and she was started on low dose amlodipine instead, for reasons of elevated creatinine.  This was advised by Ronie Spiesayna Dunn.  Despite orders given, the pt refuses to take amlodipine, and has NOT been taking Amlodipine, and has continued taking HCTZ, even though this was d/c'ed.  Pt would like for you to advise on a regimen that she can take, besides HCTZ and Amlodipine.

## 2017-07-05 NOTE — Telephone Encounter (Signed)
Pt calling requesting a refill on HCTZ. Per result note:  Message from Laurann Montanaayna N Dunn, PA-C sent at 04/03/2017  4:27 PM EST ----- Please let patient know her labs show she's dried out. Would recommend to stop HCTZ and start amlodipine 2.5mg  daily in its place. She had prior edema on amlodipine but this was with higher dose, so will try lower dose. Awaiting input from Dr. Tenny Crawoss on her final echo interpretation but preliminary read shows strong heart, but some stiffness (common in high BP). I wanted Dr. Tenny Crawoss to comment on how aortic valve flows.  Given this rise in creatinine, I would advise her to follow up with her primary care in 1 week for recheck labs and BP.  Dayna Dunn PA-C  Pt stated that she told the PA that she was allergic to amlodipine and that she could not take this medication. Pt would like a call back concerning this matter and wanted to know if she should be put back on HCTZ. Please address.

## 2017-07-30 ENCOUNTER — Telehealth: Payer: Self-pay | Admitting: Cardiology

## 2017-07-30 MED ORDER — HYDROCHLOROTHIAZIDE 12.5 MG PO CAPS: 12.5000 mg | ORAL_CAPSULE | Freq: Every day | ORAL | 1 refills | Status: DC

## 2017-07-30 NOTE — Telephone Encounter (Signed)
Pt c/o swelling: STAT is pt has developed SOB within 24 hours  1) How much weight have you gained and in what time span? 3 pounds  If swelling, where is the swelling located? Feet and ankles  2) Are you currently taking a fluid pill? No, its been 3 weeks   3) Are you currently SOB? Not at the moment but she do get sob when she does things   4) Do you have a log of your daily weights (if so, list)? Yes but she don't have with her  5) Have you gained 3 pounds in a day or 5 pounds in a week? 3 pounds over a 3 week period   Have you traveled recently? no

## 2017-07-30 NOTE — Telephone Encounter (Signed)
Pt calling in to request that Dr Delton SeeNelson place her back on HCTZ 12.5 MG po daily, for her BP and weights are trending up.  Pt states that her HCTZ was discontinued a couple of months ago for reasons of elevated BUN and Creatinine.  Pt states that over the last 3 weeks, her weight has trended up 3 lbs. Pt states her BP is running in the 160s/80s and HR-60.  Pt states this is happening because her HCTZ was D/C'ed. Pt would like for Dr Delton SeeNelson to advise on adding this diuretic back for BP management and weight management.  Pt reports she also has mild lower extremity edema as well, but no other cardiac complaints. Informed the pt that Dr Delton SeeNelson is out of the office today, but I will route this message to her for further review and recommendation, and follow-up with the pt thereafter.  Pt verbalized understanding and agrees with this plan.

## 2017-07-30 NOTE — Telephone Encounter (Signed)
Spoke with the pt and informed her that per Dr Delton SeeNelson, we can restart her HCTZ 12.5 mg po daily.  Confirmed the pharmacy of choice with the pt.  Pt verbalized understanding and agrees with this plan.

## 2017-07-30 NOTE — Telephone Encounter (Signed)
You can restart

## 2018-01-02 ENCOUNTER — Other Ambulatory Visit: Payer: Self-pay | Admitting: Cardiology

## 2018-02-27 ENCOUNTER — Other Ambulatory Visit: Payer: Self-pay | Admitting: Cardiology

## 2018-03-24 ENCOUNTER — Other Ambulatory Visit: Payer: Self-pay | Admitting: Cardiology

## 2018-03-24 DIAGNOSIS — I119 Hypertensive heart disease without heart failure: Secondary | ICD-10-CM

## 2018-03-28 ENCOUNTER — Other Ambulatory Visit: Payer: Self-pay | Admitting: Cardiology

## 2018-03-28 DIAGNOSIS — I350 Nonrheumatic aortic (valve) stenosis: Secondary | ICD-10-CM

## 2018-03-28 DIAGNOSIS — I119 Hypertensive heart disease without heart failure: Secondary | ICD-10-CM

## 2018-03-28 DIAGNOSIS — E7800 Pure hypercholesterolemia, unspecified: Secondary | ICD-10-CM

## 2018-04-09 ENCOUNTER — Other Ambulatory Visit: Payer: Self-pay | Admitting: Cardiology

## 2018-04-09 DIAGNOSIS — I350 Nonrheumatic aortic (valve) stenosis: Secondary | ICD-10-CM

## 2018-04-09 DIAGNOSIS — I119 Hypertensive heart disease without heart failure: Secondary | ICD-10-CM

## 2018-04-09 DIAGNOSIS — E7800 Pure hypercholesterolemia, unspecified: Secondary | ICD-10-CM

## 2018-05-10 ENCOUNTER — Ambulatory Visit: Payer: Medicare Other | Admitting: Cardiology

## 2018-05-10 ENCOUNTER — Encounter: Payer: Self-pay | Admitting: Cardiology

## 2018-05-10 VITALS — BP 180/88 | HR 74 | Ht 63.0 in | Wt 178.4 lb

## 2018-05-10 DIAGNOSIS — I10 Essential (primary) hypertension: Secondary | ICD-10-CM

## 2018-05-10 DIAGNOSIS — E782 Mixed hyperlipidemia: Secondary | ICD-10-CM | POA: Diagnosis not present

## 2018-05-10 MED ORDER — LOSARTAN POTASSIUM 100 MG PO TABS: 100.0000 mg | ORAL_TABLET | Freq: Every day | ORAL | 2 refills | Status: DC

## 2018-05-10 NOTE — Progress Notes (Signed)
Cardiology Office Note   Date:  05/10/2018   ID:  Regina Hunter, DOB 1927-02-25, MRN [id number]  PCP:  Patient, No Pcp Per  Cardiologist: Darlin Coco MD --> Dr. Meda Coffee  Chief complain: 6 months follow-up   History of Present Illness: Regina Hunter is a 85 y.o. female was coming after 6 months, she has been followed by Dr. Mare Ferrari for hypertension, hyperlipidemia and aortic stenosis. This is a very delightful patient who states she feels fantastic she looks nonfasting she still working very active and denies any symptoms of chest pain, shortness of breath no palpitations or syncope no dizziness, occasional lower extremity edema that are managed by hydrochlorothiazide. She feels that since her Benicar was switched from name brand to generic form is not so efficient for controlling her blood pressure and lower extremity edema. She is compliant with her meds.  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 She denies any chest pain, shortness of breath, palpitations, lower extremity edema and no dizziness. She's been compliant with her medications. She walks several flight of stairs a day and hasn't noticed any change in her symptoms.  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 Denies any symptoms whatsoever, only admits to eating a lot of fried food over [place]. BP has been elevated.   Past Medical History:  Diagnosis Date  . Cardiomegaly    per CXR April 2013  . Cataract   . CKD (chronic kidney disease), stage III ([facility])   . Fractured patella    s/p repair by Dr. Tonita Cong  . HTN (hypertension)   . Hyperlipidemia   . Hypertensive heart disease without heart failure   . LVH (left ventricular hypertrophy)    per echo April 2013  . Murmur    Past Surgical History:  Procedure Laterality Date  . CATARACT EXTRACTION     Current  Outpatient Medications  Medication Sig Dispense Refill  . hydrochlorothiazide (MICROZIDE) 12.5 MG capsule Take 1 capsule (12.5 mg total) by mouth daily. Please keep upcoming appt for future refills. 90 capsule 0  . TOPROL XL 50 MG 24 hr tablet TAKE 1 TABLET BY MOUTH  DAILY WITH OR IMMEDIATLEY  FOLLOWING A MEAL 90 tablet 0  . losartan (COZAAR) 100 MG tablet Take 1 tablet (100 mg total) by mouth daily. 90 tablet 2   No current facility-administered medications for this visit.     Allergies:   Amlodipine and Simvastatin    Social History:  The patient  reports that she has never smoked. She has never used smokeless tobacco. She reports that she does not drink alcohol or use drugs.   Family History:  The patient's family history includes Heart failure in her mother.   ROS:  Please see the history of present illness.   Otherwise, review of systems are positive for none.   All other systems are reviewed and negative.   PHYSICAL EXAM: VS:  BP (!) 180/88   Pulse 74   Ht '5\' 3"'$  (1.6 m)   Wt 178 lb 6.4 oz (80.9 kg)   SpO2 93%   BMI 31.60 kg/m  , BMI Body mass index is 31.6 kg/m. GEN: Well nourished, well developed, in no acute distress  HEENT: normal  Neck: no JVD, carotid bruits, or masses Cardiac: Regular sinus rhythm.  There is a grade 2/6 systolic  ejection murmur at the base.  No diastolic murmur.  No gallop or rub.  No peripheral edema. Respiratory:  clear to auscultation bilaterally, normal work of breathing GI: soft, nontender, nondistended, + BS MS: no deformity or atrophy  Skin: warm and dry, no rash Neuro:  Strength and sensation are intact Psych: euthymic mood, full affect  EKG:  EKG is not ordered today.  Recent Labs: No results found for requested labs within last 8760 hours.   Lipid Panel    Component Value Date/Time   CHOL 238 (H) 05/22/2016 0805   TRIG 144 05/22/2016 0805   HDL 60 05/22/2016 0805   CHOLHDL 4.0 05/22/2016 0805   CHOLHDL 3.9 05/27/2015 0814    VLDL 30 05/27/2015 0814   LDLCALC 149 (H) 05/22/2016 0805   LDLDIRECT 156.5 05/28/2013 0933   Wt Readings from Last 3 Encounters:  05/10/18 178 lb 6.4 oz (80.9 kg)  03/15/17 180 lb 12.8 oz (82 kg)  06/22/16 177 lb (80.3 kg)    TTE: 2013 - Left ventricle: The cavity size was normal. Wall thickness was increased in a pattern of mild LVH. Systolic function was normal. The estimated ejection fraction was in the range of 55% to 60%. - Aortic valve: There was mild stenosis. Mean gradient: 26m Hg (S). Peak gradient: 252mHg (S). - Mitral valve: Mild regurgitation. - Left atrium: The atrium was mildly dilated. - Atrial septum: No defect or patent foramen ovale was identified. - Pulmonary arteries: PA peak pressure: 4512mg (S).  ECG: Performed today 11/18/2015 showed Sinus rhythm, nonspecific ST-T wave abnormalities unchanged from prior otherwise normal EKG.   ASSESSMENT AND PLAN:  1. Hypertensive heart disease without heart failure  -switch to losartan 100 mg po daily, continue HCTZ, no BB as she has h/o asthma and recent asthma, significant LE edema with CCB.  Follow up in 4 months, if still hypertensive at that time, we will add hydralzine.   2. Aortic stenosis - mild on the echocardiogram in 2018, asymptomatic still has S2 so no echo is needed right now.   3. Hyperlipidemia with borderline LDL of 149 and high HDL and normal triglycerides. Considering she doesn't have a known coronary artery disease and his 87 6ar old with great memory I wouldn't push for any cholesterol management.   Current medicines are reviewed at length with the patient today.  The patient does not have concerns regarding medicines.  The following changes have been made:  no change  Labs/ tests ordered today include:   Orders Placed This Encounter  Procedures  . Comp Met (CMET)  . CBC w/Diff  . TSH  . Lipid Profile  . EKG 12-Lead   Follow up in 4 months, if still hypertensive at that time,  we will add hydralzine.   Disposition: Continue current medication.  Await today's labs.  Recheck in 6 months for office visit and EKG with Dr. NelMeda Coffeeigned, KatEna DawleyD 05/10/2018 9:50 AM    [place] Park[place]  [phone]: [phone]: [phone]

## 2018-05-10 NOTE — Patient Instructions (Signed)
Medication Instructions:   STOP TAKING BENICAR  START TAKING LOSARTAN 100 MG BY MOUTH DAILY  If you need a refill on your cardiac medications before your next appointment, please call your pharmacy.     Lab work:  TODAY--CMET, CBC W DIFF, TSH, AND LIPIDS  If you have labs (blood work) drawn today and your tests are completely normal, you will receive your results only by: Marland Kitchen MyChart Message (if you have MyChart) OR . A paper copy in the mail If you have any lab test that is abnormal or we need to change your treatment, we will call you to review the results.    Follow-Up:  4 MONTHS WITH DR Delton See

## 2018-05-11 LAB — COMPREHENSIVE METABOLIC PANEL
ALT: 12 IU/L (ref 0–32)
AST: 18 IU/L (ref 0–40)
Albumin/Globulin Ratio: 1.5 (ref 1.2–2.2)
Albumin: 4.1 g/dL (ref 3.5–4.7)
Alkaline Phosphatase: 84 IU/L (ref 39–117)
BUN/Creatinine Ratio: 24 (ref 12–28)
BUN: 29 mg/dL — ABNORMAL HIGH (ref 8–27)
Bilirubin Total: 0.6 mg/dL (ref 0.0–1.2)
CO2: 21 mmol/L (ref 20–29)
Calcium: 9.3 mg/dL (ref 8.7–10.3)
Chloride: 99 mmol/L (ref 96–106)
Creatinine, Ser: 1.23 mg/dL — ABNORMAL HIGH (ref 0.57–1.00)
GFR calc Af Amer: 45 mL/min/{1.73_m2} — ABNORMAL LOW (ref >59)
GFR calc non Af Amer: 39 mL/min/{1.73_m2} — ABNORMAL LOW (ref >59)
Globulin, Total: 2.7 g/dL (ref 1.5–4.5)
Glucose: 91 mg/dL (ref 65–99)
Potassium: 4.7 mmol/L (ref 3.5–5.2)
Sodium: 141 mmol/L (ref 134–144)
Total Protein: 6.8 g/dL (ref 6.0–8.5)

## 2018-05-11 LAB — CBC WITH DIFFERENTIAL/PLATELET
Basophils Absolute: 0.1 10*3/uL (ref 0.0–0.2)
Basos: 1 %
EOS (ABSOLUTE): 0.5 10*3/uL — ABNORMAL HIGH (ref 0.0–0.4)
Eos: 5 %
Hematocrit: 36.3 % (ref 34.0–46.6)
Hemoglobin: 12.2 g/dL (ref 11.1–15.9)
Immature Grans (Abs): 0 10*3/uL (ref 0.0–0.1)
Immature Granulocytes: 0 %
Lymphocytes Absolute: 2.2 10*3/uL (ref 0.7–3.1)
Lymphs: 22 %
MCH: 28.8 pg (ref 26.6–33.0)
MCHC: 33.6 g/dL (ref 31.5–35.7)
MCV: 86 fL (ref 79–97)
Monocytes Absolute: 0.9 10*3/uL (ref 0.1–0.9)
Monocytes: 9 %
Neutrophils Absolute: 6.4 10*3/uL (ref 1.4–7.0)
Neutrophils: 63 %
Platelets: 323 10*3/uL (ref 150–450)
RBC: 4.24 x10E6/uL (ref 3.77–5.28)
RDW: 12.2 % (ref 11.7–15.4)
WBC: 10.1 10*3/uL (ref 3.4–10.8)

## 2018-05-11 LAB — LIPID PANEL
Chol/HDL Ratio: 3.7 ratio (ref 0.0–4.4)
Cholesterol, Total: 213 mg/dL — ABNORMAL HIGH (ref 100–199)
HDL: 58 mg/dL (ref >39)
LDL Calculated: 133 mg/dL — ABNORMAL HIGH (ref 0–99)
Triglycerides: 108 mg/dL (ref 0–149)
VLDL Cholesterol Cal: 22 mg/dL (ref 5–40)

## 2018-05-11 LAB — TSH: TSH: 1.67 u[IU]/mL (ref 0.450–4.500)

## 2018-05-23 ENCOUNTER — Other Ambulatory Visit: Payer: Self-pay | Admitting: Cardiology

## 2018-07-21 ENCOUNTER — Other Ambulatory Visit: Payer: Self-pay | Admitting: Cardiology

## 2018-07-21 DIAGNOSIS — I119 Hypertensive heart disease without heart failure: Secondary | ICD-10-CM

## 2018-08-27 ENCOUNTER — Encounter: Payer: Self-pay | Admitting: *Deleted

## 2018-08-27 ENCOUNTER — Telehealth: Payer: Self-pay | Admitting: *Deleted

## 2018-08-27 MED ORDER — OLMESARTAN MEDOXOMIL 40 MG PO TABS: 40.0000 mg | ORAL_TABLET | Freq: Every day | ORAL | 1 refills | Status: DC

## 2018-08-27 NOTE — Telephone Encounter (Signed)
Refilled the pts olmesartan 40 mg po daily and sent to her confirmed pharmacy of choice.  Pt made aware that this med was sent in. Pt verbalized understanding and was more than gracious for all the assistance provided.

## 2018-08-27 NOTE — Telephone Encounter (Signed)
Virtual Visit Pre-Appointment Phone Call  "(Name), I am calling you today to discuss your upcoming appointment. We are currently trying to limit exposure to the virus that causes COVID-19 by seeing patients at home rather than in the office."  1. "What is the BEST phone number to call the day of the visit?" - include this in appointment notes YES THIS WAS UPDATED  2. Do you have or have access to (through a family member/friend) a smartphone with video capability that we can use for your visit?" If yes - list this number in appt notes as cell (if different from BEST phone #) and list the appointment type as a VIDEO visit in appointment notes-YES THE PT HAS A SMARTPHONE  3. Confirm consent - "In the setting of the current Covid19 crisis, you are scheduled for a (video) visit with Dr. Delton See on 09/11/18 at 9:20 am.  Just as we do with many in-office visits, in order for you to participate in this visit, we must obtain consent.  If you'd like, I can send this to your mychart (if signed up) or email for you to review.  Otherwise, I can obtain your verbal consent now.  All virtual visits are billed to your insurance company just like a normal visit would be.  By agreeing to a virtual visit, we'd like you to understand that the technology does not allow for your provider to perform an examination, and thus may limit your provider's ability to fully assess your condition. If your provider identifies any concerns that need to be evaluated in person, we will make arrangements to do so.  Finally, though the technology is pretty good, we cannot assure that it will always work on either your or our end, and in the setting of a video visit, we may have to convert it to a phone-only visit.  In either situation, we cannot ensure that we have a secure connection.  Are you willing to proceed?" STAFF: Did the patient verbally acknowledge consent to telehealth visit? Document YES/NO here: YES PT GAVE VERBAL CONSENT TO  TREAT AS WELL AS SHE IS AWARE TO REVIEW THE CONSENT TO TREAT IN HER ACTIVE MYCHART.  4. Advise patient to be prepared - "Two hours prior to your appointment, go ahead and check your blood pressure, pulse, oxygen saturation, and your weight (if you have the equipment to check those) and write them all down. When your visit starts, your provider will ask you for this information. If you have an Apple Watch or Kardia device, please plan to have heart rate information ready on the day of your appointment. Please have a pen and paper handy nearby the day of the visit as well." YES I DID THIS.  5. Give patient instructions for  to smartphone [email or web address] as below if video visit (depending on what platform provider is using)-YES GAVE THE PT INSTRUCTIONS ABOUT DOXYME.  6. Inform patient they will receive a phone call 15 minutes prior to their appointment time (may be from unknown caller ID) so they should be prepared to answer    TELEPHONE CALL NOTE  Regina Hunter has been deemed a candidate for a follow-up tele-health visit to limit community exposure during the Covid-19 pandemic. I spoke with the patient via phone to ensure availability of phone/video source, confirm preferred email & phone number, and discuss instructions and expectations.  I reminded Regina Hunter to be prepared with any vital sign and/or heart rhythm information that could potentially be obtained via  home monitoring, at the time of her visit. I reminded Regina MerlesHelen Hunter to expect a phone call prior to her visit.  Loa SocksMartin,Leoni Goodness M, LPN 1/61/09604/28/2020 45:[id number] AM    IF USING  [email or web address] - The patient will receive a link just prior to their visit by text.     FULL LENGTH CONSENT FOR TELE-HEALTH VISIT   I hereby voluntarily request, consent and authorize [facility] and its employed or contracted physicians, physician assistants, nurse practitioners or other licensed health care professionals (the Practitioner), to provide me with telemedicine  health care services (the Services") as deemed necessary by the treating Practitioner. I acknowledge and consent to receive the Services by the Practitioner via telemedicine. I understand that the telemedicine visit will involve communicating with the Practitioner through live audiovisual communication technology and the disclosure of certain medical information by electronic transmission. I acknowledge that I have been given the opportunity to request an in-person assessment or other available alternative prior to the telemedicine visit and am voluntarily participating in the telemedicine visit.  I understand that I have the right to withhold or withdraw my consent to the use of telemedicine in the course of my care at any time, without affecting my right to future care or treatment, and that the Practitioner or I may terminate the telemedicine visit at any time. I understand that I have the right to inspect all information obtained and/or recorded in the course of the telemedicine visit and may receive copies of available information for a reasonable fee.  I understand that some of the potential risks of receiving the Services via telemedicine include:   Delay or interruption in medical evaluation due to technological equipment failure or disruption;  Information transmitted may not be sufficient (e.g. poor resolution of images) to allow for appropriate medical decision making by the Practitioner; and/or   In rare instances, security protocols could fail, causing a breach of personal health information.  Furthermore, I acknowledge that it is my responsibility to provide information about my medical history, conditions and care that is complete and accurate to the best of my ability. I acknowledge that Practitioner's advice, recommendations, and/or decision may be based on factors not within their control, such as incomplete or inaccurate data provided by me or distortions of diagnostic images or  specimens that may result from electronic transmissions. I understand that the practice of medicine is not an exact science and that Practitioner makes no warranties or guarantees regarding treatment outcomes. I acknowledge that I will receive a copy of this consent concurrently upon execution via email to the email address I last provided but may also request a printed copy by calling the office of [facility].    I understand that my insurance will be billed for this visit.   I have read or had this consent read to me.  I understand the contents of this consent, which adequately explains the benefits and risks of the Services being provided via telemedicine.   I have been provided ample opportunity to ask questions regarding this consent and the Services and have had my questions answered to my satisfaction.  I give my informed consent for the services to be provided through the use of telemedicine in my medical care  By participating in this telemedicine visit I agree to the above.  PT GAVE VERBAL CONSENT AS WELL AS CONSENT SENT TO HER MYCHART FOR HER TO REVIEW.

## 2018-08-27 NOTE — Telephone Encounter (Signed)
Please refill Benicar, thank you

## 2018-08-27 NOTE — Addendum Note (Signed)
Addended by: Loa Socks on: 08/27/2018 05:54 PM   Modules accepted: Orders

## 2018-08-27 NOTE — Telephone Encounter (Signed)
Pt is calling in to inform Dr Delton See that when she had to switch her from olmesartan 40 mg po daily to losartan 100 mg po daily (due to [facility] discontinued making benicar and advised Dr Delton See to switch her to a different regimen), she started having side effects to the losartan, causing her to be fatigued, caused edema, and caused skin peeling.  Pt then decided to discontinue this medication all together, and she restarted herself back on her old supply of olmesartan 40 mg po daily, for she had pills left over.  Pts dilemma now is she is running low on her olmesartan and will need a refill sent to [facility], for [facility] doesn't distribute that medication anymore. Pt apologizes that she did not tell Dr Delton See that all of this occurred and that she put herself back on her old regimen.  Endorsed to the pt that I will d/c losartan out of her med list and update this as an allergy, and then I will send Dr Delton See a message to obtain a whole new order to put her back on her old regimen olmesartan 40 mg po daily, and send this into [facility]. Informed the pt that once I receive this order, I will follow-up with her shortly thereafter to make her aware this was approved and sent by Dr Delton See. Pt verbalized understanding and agrees with this plan.

## 2018-09-11 ENCOUNTER — Encounter: Payer: Self-pay | Admitting: Cardiology

## 2018-09-11 ENCOUNTER — Other Ambulatory Visit: Payer: Self-pay

## 2018-09-11 ENCOUNTER — Telehealth (INDEPENDENT_AMBULATORY_CARE_PROVIDER_SITE_OTHER): Payer: Medicare Other | Admitting: Cardiology

## 2018-09-11 ENCOUNTER — Encounter: Payer: Self-pay | Admitting: *Deleted

## 2018-09-11 VITALS — BP 158/78 | HR 59 | Ht 63.0 in | Wt 170.5 lb

## 2018-09-11 DIAGNOSIS — I350 Nonrheumatic aortic (valve) stenosis: Secondary | ICD-10-CM

## 2018-09-11 DIAGNOSIS — E782 Mixed hyperlipidemia: Secondary | ICD-10-CM

## 2018-09-11 DIAGNOSIS — I119 Hypertensive heart disease without heart failure: Secondary | ICD-10-CM

## 2018-09-11 NOTE — Progress Notes (Signed)
Virtual Visit via Video Note   This visit type was conducted due to national recommendations for restrictions regarding the COVID-19 Pandemic (e.g. social distancing) in an effort to limit this patient's exposure and mitigate transmission in our community.  Due to her co-morbid illnesses, this patient is at least at moderate risk for complications without adequate follow up.  This format is felt to be most appropriate for this patient at this time.  All issues noted in this document were discussed and addressed.  A limited physical exam was performed with this format.  Please refer to the patient's chart for her consent to telehealth for [facility].   Date:  09/11/2018   ID:  Regina Hunter, DOB 02/11/1927, MRN [id number]  Patient Location: Home Provider Location: Home  PCP:  Patient, No Pcp Per  Cardiologist:  Tobias Alexander, MD  Electrophysiologist:  None   Evaluation Performed:  Follow-Up Visit  Chief Complaint:  none  History of Present Illness:    Regina Hunter is a 85 y.o. female who has been followed by Dr. Patty Sermons for hypertension, hyperlipidemia and aortic stenosis. This is a very delightful patient who is still working very active and denies any symptoms of chest pain, shortness of breath no palpitations or syncope no dizziness, occasional lower extremity edema that are managed by hydrochlorothiazide.   She has been feeling great, denies any chest pain, SOB, palpitations, dizziness, she walks 12 stairs 2x/day without symptoms, continues to work and walk daily.   The patient does not have symptoms concerning for COVID-19 infection (fever, chills, cough, or new shortness of breath).    Past Medical History:  Diagnosis Date  . Cardiomegaly    per CXR April 2013  . Cataract   . CKD (chronic kidney disease), stage III ([facility])   . Fractured patella    s/p repair by Dr. Shelle Iron  . HTN (hypertension)   . Hyperlipidemia   . Hypertensive heart disease without heart  failure   . LVH (left ventricular hypertrophy)    per echo April 2013  . Murmur    Past Surgical History:  Procedure Laterality Date  . CATARACT EXTRACTION       Current Meds  Medication Sig  . hydrochlorothiazide (MICROZIDE) 12.5 MG capsule TAKE 1 CAPSULE BY MOUTH  DAILY  . olmesartan (BENICAR) 40 MG tablet Take 1 tablet (40 mg total) by mouth daily.  . TOPROL XL 50 MG 24 hr tablet TAKE 1 TABLET BY MOUTH  DAILY WITH OR IMMEDIATLEY  FOLLOWING A MEAL     Allergies:   Amlodipine; Losartan; and Simvastatin   Social History   Tobacco Use  . Smoking status: Never Smoker  . Smokeless tobacco: Never Used  Substance Use Topics  . Alcohol use: No  . Drug use: No     Family Hx: The patient's family history includes Heart failure in her mother. There is no history of Anemia, Arrhythmia, Asthma, Clotting disorder, Fainting, Heart attack, Heart disease, Hyperlipidemia, or Hypertension.  ROS:   Please see the history of present illness.    All other systems reviewed and are negative.   Prior CV studies:   The following studies were reviewed today:   Labs/Other Tests and Data Reviewed:    EKG:  No ECG reviewed.  Recent Labs: 05/10/2018: ALT 12; BUN 29; Creatinine, Ser 1.23; Hemoglobin 12.2; Platelets 323; Potassium 4.7; Sodium 141; TSH 1.670   Recent Lipid Panel Lab Results  Component Value Date/Time   CHOL 213 (H) 05/10/2018 09:55 AM  TRIG 108 05/10/2018 09:55 AM   HDL 58 05/10/2018 09:55 AM   CHOLHDL 3.7 05/10/2018 09:55 AM   CHOLHDL 3.9 05/27/2015 08:14 AM   LDLCALC 133 (H) 05/10/2018 09:55 AM   LDLDIRECT 156.5 05/28/2013 09:33 AM    Wt Readings from Last 3 Encounters:  09/11/18 170 lb 8 oz (77.3 kg)  05/10/18 178 lb 6.4 oz (80.9 kg)  03/15/17 180 lb 12.8 oz (82 kg)     Objective:    Vital Signs:  BP (!) 158/78   Pulse (!) 59   Ht 5\' 3"  (1.6 m)   Wt 170 lb 8 oz (77.3 kg)   BMI 30.20 kg/m    VITAL SIGNS:  reviewed    ASSESSMENT & PLAN:    1.  Hypertensive heart disease without heart failure  - on olmesartan 40 mg po daily and HCTZ 1.5 mg po daily, states that its controlled, elevated only today.  2. Aortic stenosis - mild on the echocardiogram in 2018, asymptomatic, we will repeat echo prior to her next visit in 6 months.  3. Hyperlipidemia - LDL improved to 133, she is on no statin considering she doesn't have a known coronary artery disease and his 83 year old with great memory I wouldn't push for any cholesterol management. Advised to start taking red yeast rice 600 mg po daily.  COVID-19 Education: The signs and symptoms of COVID-19 were discussed with the patient and how to seek care for testing (follow up with PCP or arrange E-visit).  The importance of social distancing was discussed today.  Time:   Today, I have spent 15 minutes with the patient with telehealth technology discussing the above problems.     Medication Adjustments/Labs and Tests Ordered: Current medicines are reviewed at length with the patient today.  Concerns regarding medicines are outlined above.   Tests Ordered: No orders of the defined types were placed in this encounter.   Medication Changes: No orders of the defined types were placed in this encounter.   Disposition:  Follow up in 6 month(s)  Signed, Tobias AlexanderKatarina Jahaira Earnhart, MD  09/11/2018 9:36 AM    [facility]

## 2018-09-11 NOTE — Patient Instructions (Signed)
Medication Instructions:   Your physician recommends that you continue on your current medications as directed. Please refer to the Current Medication list given to you today.  If you need a refill on your cardiac medications before your next appointment, please call your pharmacy.     Testing/Procedures:  Your physician has requested that you have an echocardiogram. Echocardiography is a painless test that uses sound waves to create images of your heart. It provides your doctor with information about the size and shape of your heart and how well your heart's chambers and valves are working. This procedure takes approximately one hour. There are no restrictions for this procedure. DR Delton See WOULD LIKE THIS SCHEDULED PRIOR TO YOUR 6 MONTH FOLLOW-UP APPOINTMENT WITH HER.  OUR SCHEDULERS WILL BE CALLING YOU SOON TO HAVE THIS SCHEDULED.    Follow-Up: At [facility], you and your health needs are our priority.  As part of our continuing mission to provide you with exceptional heart care, we have created designated Provider Care Teams.  These Care Teams include your primary Cardiologist (physician) and Advanced Practice Providers (APPs -  Physician Assistants and Nurse Practitioners) who all work together to provide you with the care you need, when you need it.  Your physician wants you to follow-up in: 6 MONTHS WITH DR Johnell Comings will receive a reminder letter in the mail two months in advance. If you don't receive a letter, please call our office to schedule the follow-up appointment.  DR Delton See WOULD LIKE YOUR ECHO TO BE SCHEDULED AND DONE PRIOR TO THIS VISIT

## 2018-09-25 ENCOUNTER — Telehealth: Payer: Self-pay | Admitting: *Deleted

## 2018-09-25 NOTE — Telephone Encounter (Signed)
-----   Message from Sherrilyn Rist sent at 09/13/2018  3:59 PM EDT ----- Regarding: RE: PLEASE SCHEDULE ECHO IN 6 MONTHS AND SCHEDULE NOW PER DR NELSON F/u  Echo is in my workqueue for Nov. Thanks  Marlowe Kays  ----- Message ----- From: Mathews Robinsons Sent: 09/12/2018   2:47 PM EDT To: Loa Socks, LPN, Sherrilyn Rist Subject: RE: PLEASE SCHEDULE ECHO IN 6 MONTHS AND SCH#  AVS mailed/thanks renee ----- Message ----- From: Loa Socks, LPN Sent: 5/79/7282   9:55 AM EDT To: Sherrilyn Rist, [facility] Subject: PLEASE SCHEDULE ECHO IN 6 MONTHS AND SCHEDUL#  DR NELSON DID A VIRTUAL ON THIS PT TODAY AND WANTED HER TO FOLLOW-UP WITH HER IN 6 MONTHS AND HAVE AN ECHO SCHEDULED PRIOR TO THAT APPT.  DR Delton See AWARE THAT OV APPT CAN'T BE SCHEDULED YET BUT SHE WOULD LIKE FOR YOU TO GO AHEAD AND CALL PT AND SCHEDULE HER ECHO FOR 5 1/2 -6 MONTHS OUT. THIS IS FOR AORTIC STENOSIS.  ORDER IS IN AND PT IS AWARE THAT YOU WILL CALL TODAY TO SCHEDULE. CAN YOU PLEASE ARRANGE AND SHOOT ME THE DATE TO PLACE IN FOLLOW-UP BOOK?  THANKS SO MUCH, Sartaj Hoskin

## 2019-02-07 ENCOUNTER — Other Ambulatory Visit: Payer: Self-pay | Admitting: Cardiology

## 2019-02-07 DIAGNOSIS — I119 Hypertensive heart disease without heart failure: Secondary | ICD-10-CM

## 2019-02-15 ENCOUNTER — Other Ambulatory Visit: Payer: Self-pay | Admitting: Cardiology

## 2019-03-10 ENCOUNTER — Ambulatory Visit (HOSPITAL_COMMUNITY): Payer: Medicare Other | Attending: Internal Medicine

## 2019-03-10 ENCOUNTER — Other Ambulatory Visit: Payer: Self-pay

## 2019-03-10 DIAGNOSIS — I350 Nonrheumatic aortic (valve) stenosis: Secondary | ICD-10-CM

## 2019-04-02 ENCOUNTER — Other Ambulatory Visit: Payer: Self-pay | Admitting: Cardiology

## 2019-04-21 ENCOUNTER — Other Ambulatory Visit: Payer: Self-pay | Admitting: *Deleted

## 2019-04-21 DIAGNOSIS — I119 Hypertensive heart disease without heart failure: Secondary | ICD-10-CM

## 2019-04-21 MED ORDER — METOPROLOL SUCCINATE ER 50 MG PO TB24: ORAL_TABLET | ORAL | 3 refills | Status: DC

## 2019-04-21 MED ORDER — HYDROCHLOROTHIAZIDE 12.5 MG PO CAPS: 12.5000 mg | ORAL_CAPSULE | Freq: Every day | ORAL | 3 refills | Status: DC

## 2019-05-21 ENCOUNTER — Other Ambulatory Visit: Payer: Self-pay

## 2019-05-21 ENCOUNTER — Encounter: Payer: Self-pay | Admitting: Cardiology

## 2019-05-21 ENCOUNTER — Ambulatory Visit: Payer: Medicare Other | Admitting: Cardiology

## 2019-05-21 VITALS — BP 140/76 | HR 66 | Ht 63.0 in | Wt 172.8 lb

## 2019-05-21 DIAGNOSIS — I10 Essential (primary) hypertension: Secondary | ICD-10-CM | POA: Diagnosis not present

## 2019-05-21 DIAGNOSIS — I119 Hypertensive heart disease without heart failure: Secondary | ICD-10-CM | POA: Diagnosis not present

## 2019-05-21 DIAGNOSIS — E782 Mixed hyperlipidemia: Secondary | ICD-10-CM | POA: Diagnosis not present

## 2019-05-21 DIAGNOSIS — E7800 Pure hypercholesterolemia, unspecified: Secondary | ICD-10-CM

## 2019-05-21 NOTE — Progress Notes (Signed)
Cardiology Office Note   Date:  05/21/2019   ID:  Regina Hunter, DOB [birth year], MRN [id number]  PCP:  Patient, No Pcp Per  Cardiologist: Darlin Coco MD --> Dr. Meda Coffee  Reason for visit: 6 months follow-up  History of Present Illness: Regina Hunter is a 86 y.o. female was coming after 6 months, she has been followed by Dr. Mare Ferrari for hypertension, hyperlipidemia and aortic stenosis. This is a very delightful patient who states she feels fantastic she looks nonfasting she still working very active and denies any symptoms of chest pain, shortness of breath no palpitations or syncope no dizziness, occasional lower extremity edema that are managed by hydrochlorothiazide. She feels that since her Benicar was switched from name brand to generic form is not so efficient for controlling her blood pressure and lower extremity edema. She is compliant with her meds.  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 She has been compliant with her meds and has no side effects.  Past Medical History:  Diagnosis Date  . Cardiomegaly    per CXR April 2013  . Cataract   . CKD (chronic kidney disease), stage III   . Fractured patella    s/p repair by Dr. Tonita Cong  . HTN (hypertension)   . Hyperlipidemia   . Hypertensive heart disease without heart failure   . LVH (left ventricular hypertrophy)    per echo April 2013  . Murmur    Past Surgical History:  Procedure Laterality Date  . CATARACT EXTRACTION     Current Outpatient Medications  Medication Sig Dispense Refill  . hydrochlorothiazide (MICROZIDE) 12.5 MG capsule Take 1 capsule (12.5 mg total) by mouth daily. 90 capsule 3  . metoprolol succinate (TOPROL XL) 50 MG 24 hr tablet TAKE 1 TABLET BY MOUTH  DAILY WITH OR IMMEDIATLEY  FOLLOWING A MEAL 90 tablet 3  .  olmesartan (BENICAR) 40 MG tablet TAKE 1 TABLET(40 MG) BY MOUTH DAILY 90 tablet 1  . Red Yeast Rice 600 MG TABS Take 1 tablet by mouth 2 (two) times daily.     No current facility-administered medications for this visit.    Allergies:   Amlodipine, Losartan, and Simvastatin    Social History:  The patient  reports that she has never smoked. She has never used smokeless tobacco. She reports that she does not drink alcohol or use drugs.   Family History:  The patient's family history includes Heart failure in her mother.   ROS:  Please see the history of present illness.   Otherwise, review of systems are positive for none.   All other systems are reviewed and negative.   PHYSICAL EXAM: VS:  BP 140/76   Pulse 66   Ht '5\' 3"'$  (1.6 m)   Wt 172 lb 12.8 oz (78.4 kg)   SpO2 99%   BMI 30.61 kg/m  , BMI Body mass index is 30.61 kg/m. GEN: Well nourished, well developed, in no acute distress  HEENT: normal  Neck: no JVD, carotid bruits, or masses Cardiac: Regular sinus rhythm.  There is a grade 2/6 systolic ejection murmur at the base.  No diastolic murmur.  No gallop or rub.  No peripheral edema. Respiratory:  clear to auscultation bilaterally, normal work of breathing GI: soft, nontender, nondistended, + BS MS: no deformity or atrophy  Skin: warm and  dry, no rash Neuro:  Strength and sensation are intact Psych: euthymic mood, full affect  EKG:  EKG is ordered today. It shows sinus rhythm nonspecific ST-T wave abnormalities. Unchanged from prior.  Recent Labs: No results found for requested labs within last 8760 hours.   Lipid Panel    Component Value Date/Time   CHOL 213 (H) 05/10/2018 0955   TRIG 108 05/10/2018 0955   HDL 58 05/10/2018 0955   CHOLHDL 3.7 05/10/2018 0955   CHOLHDL 3.9 05/27/2015 0814   VLDL 30 05/27/2015 0814   LDLCALC 133 (H) 05/10/2018 0955   LDLDIRECT 156.5 05/28/2013 0933   Wt Readings from Last 3 Encounters:  05/21/19 172 lb 12.8 oz (78.4 kg)   09/11/18 170 lb 8 oz (77.3 kg)  05/10/18 178 lb 6.4 oz (80.9 kg)    TTE: 2013 - Left ventricle: The cavity size was normal. Wall thickness was increased in a pattern of mild LVH. Systolic function was normal. The estimated ejection fraction was in the range of 55% to 60%. - Aortic valve: There was mild stenosis. Mean gradient: 45m Hg (S). Peak gradient: 279mHg (S). - Mitral valve: Mild regurgitation. - Left atrium: The atrium was mildly dilated. - Atrial septum: No defect or patent foramen ovale was identified. - Pulmonary arteries: PA peak pressure: 4565mg (S).  ECG: Performed today 11/18/2015 showed Sinus rhythm, nonspecific ST-T wave abnormalities unchanged from prior otherwise normal EKG.  TTE: 03/2019 1. Left ventricular ejection fraction, by visual estimation, is 65 to 70%. The left ventricle has normal function. There is mildly increased left ventricular hypertrophy.  2. Global right ventricle has normal systolic function.The right ventricular size is normal. No increase in right ventricular wall thickness.  3. Left atrial size was normal.  4. Right atrial size was normal.  5. The mitral valve is normal in structure. Trace mitral valve regurgitation.  6. The tricuspid valve is normal in structure. Tricuspid valve regurgitation is trivial.  7. The aortic valve is tricuspid. Aortic valve regurgitation is not visualized. Mild aortic valve stenosis.  8. Compared to echo from 2018, no significant change in gradients across AV.  9. The pulmonic valve was not well visualized. Pulmonic valve regurgitation is not visualized. 10. Mildly elevated pulmonary artery systolic pressure.   ASSESSMENT AND PLAN:  1. Hypertensive heart disease without heart failure  -her blood pressure is borderline today, however at home 130/70 mmHg. Considering her age this is a great blood pressure, she is not experiencing any symptoms of orthostatic hypotension, will continue the same  management.  2. Aortic stenosis - mild on the echocardiogram in 2018 and again unchanged in November 2020, we will only repeat echocardiogram if she has change in symptoms or worsening murmur without S2.  3. Hyperlipidemia with borderline LDL of 149 and high HDL and normal triglycerides. Considering she doesn't have a known coronary artery disease, she is 89 7ar old fully independent still driving, I agree with over-the-counter red yeast rice.  Current medicines are reviewed at length with the patient today.  The patient does not have concerns regarding medicines.  The following changes have been made:  no change  Labs/ tests ordered today include:   Orders Placed This Encounter  Procedures  . Comp Met (CMET)  . CBC  . TSH  . EKG 12-Lead   Follow up in 4 months, if still hypertensive at that time, we will add hydralzine.   Disposition: Continue current medication.  Await today's labs.  Recheck in 6 months for  office visit and EKG with Dr. Meda Coffee  Signed, Ena Dawley, MD 05/21/2019 3:09 PM    [address], [place], [place]  [address] Phone: [phone]; Fax: [phone]

## 2019-05-21 NOTE — Patient Instructions (Signed)
Medication Instructions:   Your physician recommends that you continue on your current medications as directed. Please refer to the Current Medication list given to you today.  *If you need a refill on your cardiac medications before your next appointment, please call your pharmacy*   Lab Work:  TODAY--CMET, CBC, AND TSH  If you have labs (blood work) drawn today and your tests are completely normal, you will receive your results only by: Marland Kitchen MyChart Message (if you have MyChart) OR . A paper copy in the mail If you have any lab test that is abnormal or we need to change your treatment, we will call you to review the results.    Follow-Up: At [facility] you and your health needs are our priority.  As part of our continuing mission to provide you with exceptional heart care, we have created designated Provider Care Teams.  These Care Teams include your primary Cardiologist (physician) and Advanced Practice Providers (APPs -  Physician Assistants and Nurse Practitioners) who all work together to provide you with the care you need, when you need it.  Your next appointment:   12 month(s)  The format for your next appointment:   In Person  Provider:   Tobias Alexander, MD  Other Instructions  TO GET ON THE LIST TO ARRANGE YOUR COVID VACCINATION PLEASE VISIT THIS SITE TO SIGN UP:  [email or web address]

## 2019-05-22 LAB — COMPREHENSIVE METABOLIC PANEL
ALT: 7 IU/L (ref 0–32)
AST: 14 IU/L (ref 0–40)
Albumin/Globulin Ratio: 1.7 (ref 1.2–2.2)
Albumin: 4.4 g/dL (ref 3.5–4.6)
Alkaline Phosphatase: 89 IU/L (ref 39–117)
BUN/Creatinine Ratio: 23 (ref 12–28)
BUN: 30 mg/dL (ref 10–36)
Bilirubin Total: 0.3 mg/dL (ref 0.0–1.2)
CO2: 27 mmol/L (ref 20–29)
Calcium: 9.5 mg/dL (ref 8.7–10.3)
Chloride: 104 mmol/L (ref 96–106)
Creatinine, Ser: 1.28 mg/dL — ABNORMAL HIGH (ref 0.57–1.00)
GFR calc Af Amer: 43 mL/min/{1.73_m2} — ABNORMAL LOW (ref >59)
GFR calc non Af Amer: 37 mL/min/{1.73_m2} — ABNORMAL LOW (ref >59)
Globulin, Total: 2.6 g/dL (ref 1.5–4.5)
Glucose: 122 mg/dL — ABNORMAL HIGH (ref 65–99)
Potassium: 4.8 mmol/L (ref 3.5–5.2)
Sodium: 146 mmol/L — ABNORMAL HIGH (ref 134–144)
Total Protein: 7 g/dL (ref 6.0–8.5)

## 2019-05-22 LAB — CBC
Hematocrit: 42 % (ref 34.0–46.6)
Hemoglobin: 13.4 g/dL (ref 11.1–15.9)
MCH: 28.6 pg (ref 26.6–33.0)
MCHC: 31.9 g/dL (ref 31.5–35.7)
MCV: 90 fL (ref 79–97)
Platelets: 297 10*3/uL (ref 150–450)
RBC: 4.68 x10E6/uL (ref 3.77–5.28)
RDW: 12.4 % (ref 11.7–15.4)
WBC: 9.2 10*3/uL (ref 3.4–10.8)

## 2019-05-22 LAB — TSH: TSH: 1.64 u[IU]/mL (ref 0.450–4.500)

## 2019-06-09 ENCOUNTER — Ambulatory Visit: Payer: Medicare Other | Attending: Internal Medicine

## 2019-06-09 DIAGNOSIS — Z23 Encounter for immunization: Secondary | ICD-10-CM

## 2019-06-09 NOTE — Progress Notes (Signed)
   Covid-19 Vaccination Clinic  Name:  Regina Hunter    MRN: [id number] DOB: 03/20/1927  06/09/2019  [name] was observed post Covid-19 immunization for 15 minutes without incidence. She was provided with Vaccine Information Sheet and instruction to access the V-Safe system.   [name] was instructed to call 911 with any severe reactions post vaccine: Marland Kitchen Difficulty breathing  . Swelling of your face and throat  . A fast heartbeat  . A bad rash all over your body  . Dizziness and weakness    Immunizations Administered    Name Date Dose VIS Date Route   Pfizer COVID-19 Vaccine 06/09/2019  1:51 PM 0.3 mL 04/11/2019 Intramuscular   Manufacturer: [employer], [employer]   Lot: [id number]   NDC: [address]-0

## 2019-06-30 ENCOUNTER — Ambulatory Visit: Payer: Medicare Other | Attending: Internal Medicine

## 2019-06-30 DIAGNOSIS — Z23 Encounter for immunization: Secondary | ICD-10-CM | POA: Insufficient documentation

## 2019-06-30 NOTE — Progress Notes (Signed)
   Covid-19 Vaccination Clinic  Name:  Regina Hunter    MRN: [id number] DOB: [birth year]  06/30/2019  [name] was observed post Covid-19 immunization for 15 minutes without incidence. She was provided with Vaccine Information Sheet and instruction to access the V-Safe system.   [name] was instructed to call 911 with any severe reactions post vaccine: Marland Kitchen Difficulty breathing  . Swelling of your face and throat  . A fast heartbeat  . A bad rash all over your body  . Dizziness and weakness    Immunizations Administered    Name Date Dose VIS Date Route   Pfizer COVID-19 Vaccine 06/30/2019  9:17 AM 0.3 mL 04/11/2019 Intramuscular   Manufacturer: [employer], [employer]   Lot: [id number]   NDC: [address]-9

## 2019-08-14 ENCOUNTER — Other Ambulatory Visit: Payer: Self-pay | Admitting: Cardiology

## 2019-08-27 ENCOUNTER — Other Ambulatory Visit: Payer: Self-pay | Admitting: Cardiology

## 2019-08-27 DIAGNOSIS — I119 Hypertensive heart disease without heart failure: Secondary | ICD-10-CM

## 2020-02-17 ENCOUNTER — Other Ambulatory Visit: Payer: Self-pay | Admitting: Cardiology

## 2020-03-02 ENCOUNTER — Other Ambulatory Visit: Payer: Self-pay | Admitting: Cardiology

## 2020-05-23 ENCOUNTER — Other Ambulatory Visit: Payer: Self-pay | Admitting: Cardiology

## 2020-05-23 DIAGNOSIS — I119 Hypertensive heart disease without heart failure: Secondary | ICD-10-CM

## 2020-08-02 NOTE — Progress Notes (Signed)
Cardiology Office Note:    Date:  08/09/2020   ID:  Regina Hunter, DOB [birth year], MRN [id number]  PCP:  Patient, No Pcp Per (Inactive)   [facility]  Cardiologist:  Tobias Alexander, MD  Advanced Practice Provider:  No care team member to display Electrophysiologist:  None   Referring MD: No ref. provider found     History of Present Illness:    Regina Hunter is a 87 y.o. female with a hx of HTN, HLD, CKD stage III, and mild aortic stenosis who was previously followed by Dr. Delton See now returning to clinic for follow-up.  Last saw Dr. Delton See on 05/21/19 where she was doing well. No medication changes made at this time.  Today, the patient feels well. No chest pain, shortness of breath, nausea, lightheadedness or orthopnea. Blood pressure is well controlled. 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 No exertional limitations and feels well with activity. Tolerating all medications.  Past Medical History:  Diagnosis Date  . Cardiomegaly    per CXR April 2013  . Cataract   . CKD (chronic kidney disease), stage III (HCC)   . Fractured patella    s/p repair by Dr. Shelle Iron  . HTN (hypertension)   . Hyperlipidemia   . Hypertensive heart disease without heart failure   . LVH (left ventricular hypertrophy)    per echo April 2013  . Murmur     Past Surgical History:  Procedure Laterality Date  . CATARACT EXTRACTION      Current Medications: Current Meds  Medication Sig  . Red Yeast Rice 600 MG TABS Take 1 tablet by mouth 2 (two) times daily.  . [DISCONTINUED] hydrochlorothiazide (MICROZIDE) 12.5 MG capsule TAKE 1 CAPSULE(12.5 MG) BY MOUTH DAILY  . [DISCONTINUED] metoprolol succinate (TOPROL-XL) 50 MG 24 hr tablet TAKE 1 TABLET BY MOUTH DAILY WITH OR IMMEDIATELY FOLLOWING A MEAL  . [DISCONTINUED] olmesartan (BENICAR) 40 MG tablet TAKE 1 TABLET(40 MG) BY MOUTH DAILY     Allergies:   Amlodipine, Losartan, and Simvastatin   Social  History   Socioeconomic History  . Marital status: Married    Spouse name: Not on file  . Number of children: Not on file  . Years of education: Not on file  . Highest education level: Not on file  Occupational History  . Not on file  Tobacco Use  . Smoking status: Never Smoker  . Smokeless tobacco: Never Used  Vaping Use  . Vaping Use: Never used  Substance and Sexual Activity  . Alcohol use: No  . Drug use: No  . Sexual activity: Not Currently  Other Topics Concern  . Not on file  Social History Narrative  . Not on file   Social Determinants of Health   Financial Resource Strain: Not on file  Food Insecurity: Not on file  Transportation Needs: Not on file  Physical Activity: Not on file  Stress: Not on file  Social Connections: Not on file     Family History: The patient's family history includes Heart failure in her mother. There is no history of Anemia, Arrhythmia, Asthma, Clotting disorder, Fainting, Heart attack, Heart disease, Hyperlipidemia, or Hypertension.  ROS:   Please see the history of present illness.    Review of Systems  Constitutional: Negative for chills, fever and malaise/fatigue.  HENT: Negative for hearing loss.   Eyes: Negative for blurred vision and redness.  Respiratory: Negative for shortness of breath.   Cardiovascular: Negative for chest pain,  palpitations, orthopnea, claudication, leg swelling and PND.  Gastrointestinal: Negative for melena, nausea and vomiting.  Genitourinary: Negative for dysuria and flank pain.  Musculoskeletal: Negative for falls.  Neurological: Negative for dizziness and loss of consciousness.  Endo/Heme/Allergies: Negative for polydipsia.  Psychiatric/Behavioral: Negative for substance abuse.    EKGs/Labs/Other Studies Reviewed:    The following studies were reviewed today: TTE 04/09/2019: 1. Left ventricular ejection fraction, by visual estimation, is 65 to  70%. The left ventricle has normal function. There  is mildly increased  left ventricular hypertrophy.  2. Global right ventricle has normal systolic function.The right  ventricular size is normal. No increase in right ventricular wall  thickness.  3. Left atrial size was normal.  4. Right atrial size was normal.  5. The mitral valve is normal in structure. Trace mitral valve  regurgitation.  6. The tricuspid valve is normal in structure. Tricuspid valve  regurgitation is trivial.  7. The aortic valve is tricuspid. Aortic valve regurgitation is not  visualized. Mild aortic valve stenosis.  8. Compared to echo from 2018, no significant change in gradients across  AV.  9. The pulmonic valve was not well visualized. Pulmonic valve  regurgitation is not visualized.  10. Mildly elevated pulmonary artery systolic pressure.   EKG:  EKG is  ordered today.  The ekg ordered today demonstrates NSR with HR 60  Recent Labs: No results found for requested labs within last 8760 hours.  Recent Lipid Panel    Component Value Date/Time   CHOL 213 (H) 05/10/2018 0955   TRIG 108 05/10/2018 0955   HDL 58 05/10/2018 0955   CHOLHDL 3.7 05/10/2018 0955   CHOLHDL 3.9 05/27/2015 0814   VLDL 30 05/27/2015 0814   LDLCALC 133 (H) 05/10/2018 0955   LDLDIRECT 156.5 05/28/2013 0933     Physical Exam:    VS:  BP 124/82   Pulse 65   Ht 5\' 3"  (1.6 m)   Wt 160 lb 12.8 oz (72.9 kg)   SpO2 99%   BMI 28.48 kg/m     Wt Readings from Last 3 Encounters:  08/09/20 160 lb 12.8 oz (72.9 kg)  05/21/19 172 lb 12.8 oz (78.4 kg)  09/11/18 170 lb 8 oz (77.3 kg)     GEN:  Well nourished, well developed in no acute distress HEENT: Normal NECK: No JVD; No carotid bruits CARDIAC: RRR, 2/6 systolic murmur. No rubs, gallops RESPIRATORY:  Clear to auscultation without rales, wheezing or rhonchi  ABDOMEN: Soft, non-tender, non-distended MUSCULOSKELETAL:  No edema; No deformity  SKIN: Warm and dry NEUROLOGIC:  Alert and oriented x 3 PSYCHIATRIC:  Normal  affect   ASSESSMENT:    1. Benign hypertensive heart disease without heart failure   2. Mixed hyperlipidemia   3. Aortic stenosis, mild    PLAN:    In order of problems listed above:  #HTN: Well controlled at home.  -Continue HCTZ 12.5mg  daily -Continue metop 50mg  daily -Continue olmesartan 40mg  daily -Check BMET today  #Mild AS: Stable on TTE in 2020. Given age and asymptomatic status, no further monitoring. -Stable with no need for further monitoring  #HLD: -Continue with red yeast rice -No need for routine lipid following given age     Medication Adjustments/Labs and Tests Ordered: Current medicines are reviewed at length with the patient today.  Concerns regarding medicines are outlined above.  Orders Placed This Encounter  Procedures  . Basic metabolic panel  . EKG 12-Lead   Meds ordered this encounter  Medications  .  olmesartan (BENICAR) 40 MG tablet    Sig: TAKE 1 TABLET(40 MG) BY MOUTH DAILY    Dispense:  90 tablet    Refill:  3  . metoprolol succinate (TOPROL-XL) 50 MG 24 hr tablet    Sig: Take 1 tablet (50 mg total) by mouth daily. Take with or immediately following a meal.    Dispense:  90 tablet    Refill:  3  . hydrochlorothiazide (MICROZIDE) 12.5 MG capsule    Sig: TAKE 1 CAPSULE(12.5 MG) BY MOUTH DAILY    Dispense:  90 capsule    Refill:  3    Patient Instructions  Medication Instructions:   Your physician recommends that you continue on your current medications as directed. Please refer to the Current Medication list given to you today.  *If you need a refill on your cardiac medications before your next appointment, please call your pharmacy*   Lab Work:  TODAY--BMET  If you have labs (blood work) drawn today and your tests are completely normal, you will receive your results only by: Marland Kitchen MyChart Message (if you have MyChart) OR . A paper copy in the mail If you have any lab test that is abnormal or we need to change your treatment, we  will call you to review the results.   Follow-Up: At [facility], you and your health needs are our priority.  As part of our continuing mission to provide you with exceptional heart care, we have created designated Provider Care Teams.  These Care Teams include your primary Cardiologist (physician) and Advanced Practice Providers (APPs -  Physician Assistants and Nurse Practitioners) who all work together to provide you with the care you need, when you need it.  We recommend signing up for the patient portal called "MyChart".  Sign up information is provided on this After Visit Summary.  MyChart is used to connect with patients for Virtual Visits (Telemedicine).  Patients are able to view lab/test results, encounter notes, upcoming appointments, etc.  Non-urgent messages can be sent to your provider as well.   To learn more about what you can do with MyChart, go to [email or web address].    Your next appointment:   1 year(s)  The format for your next appointment:   In Person  Provider:   Laurance Flatten, MD        Signed, Meriam Sprague, MD  08/09/2020 10:43 AM    [facility]

## 2020-08-09 ENCOUNTER — Encounter: Payer: Self-pay | Admitting: Cardiology

## 2020-08-09 ENCOUNTER — Other Ambulatory Visit: Payer: Self-pay

## 2020-08-09 ENCOUNTER — Ambulatory Visit: Payer: Medicare Other | Admitting: Cardiology

## 2020-08-09 VITALS — BP 124/82 | HR 65 | Ht 63.0 in | Wt 160.8 lb

## 2020-08-09 DIAGNOSIS — I350 Nonrheumatic aortic (valve) stenosis: Secondary | ICD-10-CM | POA: Diagnosis not present

## 2020-08-09 DIAGNOSIS — I119 Hypertensive heart disease without heart failure: Secondary | ICD-10-CM | POA: Diagnosis not present

## 2020-08-09 DIAGNOSIS — E782 Mixed hyperlipidemia: Secondary | ICD-10-CM

## 2020-08-09 LAB — BASIC METABOLIC PANEL
BUN/Creatinine Ratio: 18 (ref 12–28)
BUN: 29 mg/dL (ref 10–36)
CO2: 24 mmol/L (ref 20–29)
Calcium: 9.6 mg/dL (ref 8.7–10.3)
Chloride: 101 mmol/L (ref 96–106)
Creatinine, Ser: 1.58 mg/dL — ABNORMAL HIGH (ref 0.57–1.00)
Glucose: 89 mg/dL (ref 65–99)
Potassium: 4.4 mmol/L (ref 3.5–5.2)
Sodium: 143 mmol/L (ref 134–144)
eGFR: 31 mL/min/{1.73_m2} — ABNORMAL LOW (ref >59)

## 2020-08-09 MED ORDER — HYDROCHLOROTHIAZIDE 12.5 MG PO CAPS: ORAL_CAPSULE | ORAL | 3 refills | Status: DC

## 2020-08-09 MED ORDER — OLMESARTAN MEDOXOMIL 40 MG PO TABS: ORAL_TABLET | ORAL | 3 refills | Status: AC

## 2020-08-09 MED ORDER — METOPROLOL SUCCINATE ER 50 MG PO TB24: 50.0000 mg | ORAL_TABLET | Freq: Every day | ORAL | 3 refills | Status: AC

## 2020-08-09 NOTE — Patient Instructions (Signed)
Medication Instructions:   Your physician recommends that you continue on your current medications as directed. Please refer to the Current Medication list given to you today.  *If you need a refill on your cardiac medications before your next appointment, please call your pharmacy*   Lab Work:  TODAY--BMET  If you have labs (blood work) drawn today and your tests are completely normal, you will receive your results only by: Marland Kitchen MyChart Message (if you have MyChart) OR . A paper copy in the mail If you have any lab test that is abnormal or we need to change your treatment, we will call you to review the results.   Follow-Up: At [facility] you and your health needs are our priority.  As part of our continuing mission to provide you with exceptional heart care, we have created designated Provider Care Teams.  These Care Teams include your primary Cardiologist (physician) and Advanced Practice Providers (APPs -  Physician Assistants and Nurse Practitioners) who all work together to provide you with the care you need, when you need it.  We recommend signing up for the patient portal called "MyChart".  Sign up information is provided on this After Visit Summary.  MyChart is used to connect with patients for Virtual Visits (Telemedicine).  Patients are able to view lab/test results, encounter notes, upcoming appointments, etc.  Non-urgent messages can be sent to your provider as well.   To learn more about what you can do with MyChart, go to [email or web address].    Your next appointment:   1 year(s)  The format for your next appointment:   In Person  Provider:   Laurance Flatten, MD

## 2020-08-10 ENCOUNTER — Telehealth: Payer: Self-pay | Admitting: *Deleted

## 2020-08-10 NOTE — Telephone Encounter (Signed)
Pt made aware of lab results and recommendations per Dr. Shari Prows.  Pt states she prefers stopping the HCTZ for now, she monitors her BP's daily and will continue to do this, and will report to [place] as needed if her BP is greater than 120/80 or she develops LEE.  She states she will continue her metoprolol and olmesartan regimen.  Pt verbalized understanding and agrees with this plan. Pt states she will keep [place] posted as needed. Pt appreciative for all the assistance provided.

## 2020-08-10 NOTE — Telephone Encounter (Signed)
-----   Message from Meriam Sprague, MD sent at 08/10/2020  9:40 AM EDT ----- Completely agree. Renal function is up.  We can give her the option of seeing how she does with just stopping the HCTZ (and continue metop/olmesartan) and seeing what her blood pressure does as she is on such a low dose (goal SBP 120s/80s or below) OR we can change her metop to coreg 6.25mg  BID as it provides better blood pressure coverage, stop HCTZ and continue olmesartan 40mg  daily. I am fine with whatever she chooses as she is 89 and we do not have to be perfect.

## 2020-08-23 ENCOUNTER — Ambulatory Visit: Payer: Medicare Other | Admitting: Cardiology

## 2020-09-23 ENCOUNTER — Telehealth: Payer: Self-pay | Admitting: Cardiology

## 2020-09-23 DIAGNOSIS — Z79899 Other long term (current) drug therapy: Secondary | ICD-10-CM

## 2020-09-23 DIAGNOSIS — R7989 Other specified abnormal findings of blood chemistry: Secondary | ICD-10-CM

## 2020-09-23 DIAGNOSIS — Z0189 Encounter for other specified special examinations: Secondary | ICD-10-CM

## 2020-09-23 DIAGNOSIS — R600 Localized edema: Secondary | ICD-10-CM

## 2020-09-23 DIAGNOSIS — I119 Hypertensive heart disease without heart failure: Secondary | ICD-10-CM

## 2020-09-23 MED ORDER — HYDROCHLOROTHIAZIDE 12.5 MG PO CAPS: 12.5000 mg | ORAL_CAPSULE | Freq: Every day | ORAL | 0 refills | Status: DC

## 2020-09-23 NOTE — Telephone Encounter (Signed)
PT IS WANTING TO SPEAK W/ NURSE IVY REGARDING EDEMA   Pt c/o medication issue:  1. Name of Medication:  hydrochlorothiazide (MICROZIDE) 12.5 MG capsule    2. How are you currently taking this medication (dosage and times per day)? Once a day   3. Are you having a reaction (difficulty breathing--STAT)? Swelling   4. What is your medication issue? Fluid pills are causing swelling

## 2020-09-23 NOTE — Telephone Encounter (Signed)
  Regina Sprague, MD  Sent: Thu Sep 23, 2020 3:26 PM  To: Loa Socks, LPN   Absolutely fine. We will just keep an eye on her renal function but no problem. Agree with your plan to just ensure she stays hydrated.  Spoke with the pt and made her aware that Dr. Shari Prows is ok with her starting back HCTZ 12.5 mg po daily and we will just follow her renal function to ensure stability. Advised the pt to make sure she stays hydrated.  Went ahead and scheduled her to come in for a BMET on 11/29/20.  This is for med management and to follow renal function.  Pt verbalized understanding and agrees with this plan.

## 2020-09-23 NOTE — Telephone Encounter (Signed)
Dr. Shari Prows, as referenced below from lab result on the pt from 08/09/20, we told her to stop HCTZ and continue her metoprolol and olmesartan regimen, due to elevated renal function (creatinine was 1.58)  from this result.  Pt states she did what we advised and held her HCTZ and continued the others, and noted her bilateral lower extremity edema returned and pressures went up for her.  Pt states she has now added low dose HCTZ 12.5 mg po daily back to her regimen, along with taking her prescribed olmesartan and metoprolol.  Pt states with that, her swelling is completely gone, her weight is same as when she was last in the office at 160 lbs, and her BP is back to normal for her, at 120/80.  Pt states she weighs and takes her BP/HR every morning.   Pt states she would like to go back on her HCTZ 12.5 mg po daily if this is ok with you?Regina Hunter  She states she will continue her olmesartan and metoprolol as directed. Pt just wants to make sure this is ok with you, and could we please add HCTZ 12.5 mg back to her med list.  She states if you want her to take HCTZ EOD, she is ok with that as well.  Pt states she is doing so well on this, she feels she can't completely go off of it again.   Advised the pt that she can take her HCTZ 12.5 mg po daily along with her other meds, and I will run this by Dr. Shari Prows to advise if this is ok or not going forward.  Advised the pt to make sure she is watching her salt intake, wear compressions during the day, elevate her lower extremities at rest, and continue weighing and monitoring her BP/HR daily.  Also advised her to make sure she is hydrating appropriately with water so she doesn't get dry, but do not be aggressive with this.   Pt verbalized understanding and agrees with this plan.  Will add HCTZ 12.5 mg po daily back to her med list, and change this accordingly if needed.     Note ----- Message from Meriam Sprague, MD sent at 08/10/2020  9:40 AM EDT  ----- Completely agree. Renal function is up.  We can give her the option of seeing how she does with just stopping the HCTZ (and continue metop/olmesartan) and seeing what her blood pressure does as she is on such a low dose (goal SBP 120s/80s or below) OR we can change her metop to coreg 6.25mg  BID as it provides better blood pressure coverage, stop HCTZ and continue olmesartan 40mg  daily. I am fine with whatever she chooses as she is 47 and we do not have to be perfect

## 2020-11-29 ENCOUNTER — Other Ambulatory Visit: Payer: Medicare Other | Admitting: *Deleted

## 2020-11-29 ENCOUNTER — Other Ambulatory Visit: Payer: Self-pay

## 2020-11-29 DIAGNOSIS — Z79899 Other long term (current) drug therapy: Secondary | ICD-10-CM

## 2020-11-29 DIAGNOSIS — I119 Hypertensive heart disease without heart failure: Secondary | ICD-10-CM

## 2020-11-29 DIAGNOSIS — R7989 Other specified abnormal findings of blood chemistry: Secondary | ICD-10-CM

## 2020-11-29 DIAGNOSIS — R600 Localized edema: Secondary | ICD-10-CM

## 2020-11-29 DIAGNOSIS — Z0189 Encounter for other specified special examinations: Secondary | ICD-10-CM

## 2020-11-29 LAB — BASIC METABOLIC PANEL
BUN/Creatinine Ratio: 15 (ref 12–28)
BUN: 28 mg/dL (ref 10–36)
CO2: 23 mmol/L (ref 20–29)
Calcium: 9.6 mg/dL (ref 8.7–10.3)
Chloride: 101 mmol/L (ref 96–106)
Creatinine, Ser: 1.89 mg/dL — ABNORMAL HIGH (ref 0.57–1.00)
Glucose: 95 mg/dL (ref 65–99)
Potassium: 4.8 mmol/L (ref 3.5–5.2)
Sodium: 142 mmol/L (ref 134–144)
eGFR: 25 mL/min/{1.73_m2} — ABNORMAL LOW (ref >59)

## 2020-11-30 ENCOUNTER — Telehealth: Payer: Self-pay | Admitting: *Deleted

## 2020-11-30 NOTE — Telephone Encounter (Signed)
Pt scheduled to see Tereso Newcomer, PA-C on 8/5

## 2020-11-30 NOTE — Telephone Encounter (Signed)
Spoke w patient and adv to stop hctz due to increased creatinine 1.89.  The pt will stop taking this but states she will end up having feet and ankle swelling.  I reviewed with Dr. Elberta Fortis (DOD) who is in agreement and recommends follow up in office w APP as well.  I have sent to Dr. Devin Going nurse for follow up as well as a message to scheduling to arrange appointment.

## 2020-12-02 NOTE — Progress Notes (Addendum)
Cardiology Office Note:    Date:  12/03/2020   ID:  Regina Hunter, DOB 31-Jan-1927, MRN [id number]  PCP:  Patient, No Pcp Per (Inactive)   [facility] Providers Cardiologist:  Meriam Sprague, MD Cardiology APP:  Kennon Rounds   Referring MD: No ref. provider found   Chief Complaint:  Follow-up (HTN)    Patient Profile:    Regina Hunter is a 87 y.o. female with:  Hypertension  Hyperlipidemia  Chronic kidney disease  Aortic stenosis  Echocardiogram 03/2019: EF 65-70, mean AV gradient 14 mmHg  Prior CV studies: Echocardiogram 03/10/2019 EF 65-70, mild LVH, normal RVSF, trace MR, trivial TR, mild AS (mean 40 mmHg), RVSP 34.9   History of Present Illness: Regina Hunter established with Dr. Shari Prows in 4/22.  She has had increasing Creatinines and her HCTZ has been stopped and resumed due to lower extremity swelling.  She returns for f/u.  She is here alone.  She has not had chest pain, syncope, orthopnea, significant shortness of breath.  Currently her leg edema is stable.  She was taken off HCTZ again several days ago when her creatinine went up to 1.89.        Past Medical History:  Diagnosis Date   Cardiomegaly    per CXR April 2013   Cataract    CKD (chronic kidney disease), stage III (HCC)    Fractured patella    s/p repair by Dr. Shelle Iron   HTN (hypertension)    Hyperlipidemia    Hypertensive heart disease without heart failure    LVH (left ventricular hypertrophy)    per echo April 2013   Murmur     Current Medications: Current Meds  Medication Sig   hydrALAZINE (APRESOLINE) 25 MG tablet Take 1 tablet (25 mg total) by mouth in the morning and at bedtime.     Allergies:   Amlodipine, Losartan, and Simvastatin   Social History   Tobacco Use   Smoking status: Never   Smokeless tobacco: Never  Vaping Use   Vaping Use: Never used  Substance Use Topics   Alcohol use: No   Drug use: No     Family Hx: The patient's family history  includes Heart failure in her mother. There is no history of Anemia, Arrhythmia, Asthma, Clotting disorder, Fainting, Heart attack, Heart disease, Hyperlipidemia, or Hypertension.  ROS   EKGs/Labs/Other Test Reviewed:    EKG:  EKG is not ordered today.  The ekg ordered today demonstrates N/A  Recent Labs: 11/29/2020: BUN 28; Creatinine, Ser 1.89; Potassium 4.8; Sodium 142   Recent Lipid Panel Lab Results  Component Value Date/Time   CHOL 213 (H) 05/10/2018 09:55 AM   TRIG 108 05/10/2018 09:55 AM   HDL 58 05/10/2018 09:55 AM   LDLCALC 133 (H) 05/10/2018 09:55 AM   LDLDIRECT 156.5 05/28/2013 09:33 AM   Lab Results  Component Value Date/Time   CREATININE 1.89 (H) 11/29/2020 09:17 AM   CREATININE 1.58 (H) 08/09/2020 08:37 AM   CREATININE 1.28 (H) 05/21/2019 03:13 PM   CREATININE 1.23 (H) 05/10/2018 09:55 AM   CREATININE 1.63 (H) 04/02/2017 04:35 PM   CREATININE 1.28 (H) 05/22/2016 08:05 AM   CREATININE 1.49 (H) 05/27/2015 08:14 AM   CREATININE 1.37 (H) 07/08/2014 08:46 AM   CREATININE 1.5 (H) 01/21/2014 09:46 AM   CREATININE 1.3 (H) 07/31/2013 07:51 AM   CREATININE 1.5 (H) 05/28/2013 09:33 AM   CREATININE 0.89 07/28/2011 11:29 AM      Risk Assessment/Calculations:  Physical Exam:    VS:  BP (!) 170/70   Pulse 64   Ht 5\' 3"  (1.6 m)   Wt 159 lb 9.6 oz (72.4 kg)   SpO2 98%   BMI 28.27 kg/m     Wt Readings from Last 3 Encounters:  12/03/20 159 lb 9.6 oz (72.4 kg)  08/09/20 160 lb 12.8 oz (72.9 kg)  05/21/19 172 lb 12.8 oz (78.4 kg)     Constitutional:      Appearance: Healthy appearance. Not in distress.  Neck:     Vascular: JVD normal.  Pulmonary:     Effort: Pulmonary effort is normal.     Breath sounds: No wheezing. No rales.  Cardiovascular:     Normal rate. Regular rhythm. Normal S1. Normal S2.      Murmurs: There is a grade 2/6 crescendo-decrescendo systolic murmur at the URSB.  Edema:    Peripheral edema present.    Ankle: bilateral trace edema of  the ankle. Abdominal:     Palpations: Abdomen is soft.  Skin:    General: Skin is warm and dry.  Neurological:     Mental Status: Alert and oriented to person, place and time.     Cranial Nerves: Cranial nerves are intact.       ASSESSMENT & PLAN:    1. Essential hypertension Blood pressure above target.  She notes that her blood pressure has been difficult to control for many years.  As noted, she has had HCTZ discontinued recently due to worsening creatinine.  Her creatinine has ranged between 1.23 to 1.89 since 2015.  However, I recommend that she take HCTZ only as needed for leg edema.  Continue current dose of olmesartan, metoprolol succinate.  Start hydralazine 25 mg twice daily.  She has a prior intolerance to amlodipine secondary to leg edema.  I have asked her to send some blood pressure readings in a couple of weeks.  Follow-up 6 months.  2. Stage 3b chronic kidney disease (HCC) As noted, HCTZ has been changed to as needed.  Obtain follow-up BMET in 2 weeks.  3. Aortic stenosis, mild Mean gradient 14 by echocardiogram in 2020.  She will likely need another echocardiogram to reassess aortic stenosis in the next 6 to 12 months.         Dispo:  Return in about 6 months (around 06/05/2021) for Routine follow up in 6 months with Dr.Pemberton. .   Medication Adjustments/Labs and Tests Ordered: Current medicines are reviewed at length with the patient today.  Concerns regarding medicines are outlined above.  Tests Ordered: Orders Placed This Encounter  Procedures   Basic Metabolic Panel (BMET)   Medication Changes: Meds ordered this encounter  Medications   hydrALAZINE (APRESOLINE) 25 MG tablet    Sig: Take 1 tablet (25 mg total) by mouth in the morning and at bedtime.    Dispense:  180 tablet    Refill:  3    Signed, 08/03/2021, PA-C  12/03/2020 12:51 PM    [facility] [address] [place] at [place], [place], [place]  [place] Phone: [phone]; Fax: [phone]

## 2020-12-03 ENCOUNTER — Ambulatory Visit: Payer: Medicare Other | Admitting: Physician Assistant

## 2020-12-03 ENCOUNTER — Other Ambulatory Visit: Payer: Self-pay

## 2020-12-03 ENCOUNTER — Encounter: Payer: Self-pay | Admitting: Physician Assistant

## 2020-12-03 VITALS — BP 170/70 | HR 64 | Ht 63.0 in | Wt 159.6 lb

## 2020-12-03 DIAGNOSIS — I350 Nonrheumatic aortic (valve) stenosis: Secondary | ICD-10-CM

## 2020-12-03 DIAGNOSIS — I10 Essential (primary) hypertension: Secondary | ICD-10-CM | POA: Diagnosis not present

## 2020-12-03 DIAGNOSIS — E782 Mixed hyperlipidemia: Secondary | ICD-10-CM

## 2020-12-03 DIAGNOSIS — N1832 Chronic kidney disease, stage 3b: Secondary | ICD-10-CM

## 2020-12-03 MED ORDER — HYDRALAZINE HCL 25 MG PO TABS: 25.0000 mg | ORAL_TABLET | Freq: Two times a day (BID) | ORAL | 3 refills | Status: DC

## 2020-12-03 NOTE — Patient Instructions (Addendum)
Medication Instructions:   START Hydralazine one tablet by mouth ( 25 mg ) twice daily.  *If you need a refill on your cardiac medications before your next appointment, please call your pharmacy*   Lab Work:  Your physician recommends that you return for lab work on August 22 between 7:30 - 4:30.   If you have labs (blood work) drawn today and your tests are completely normal, you will receive your results only by: MyChart Message (if you have MyChart) OR A paper copy in the mail If you have any lab test that is abnormal or we need to change your treatment, we will call you to review the results.   Testing/Procedures:  -NONE   Follow-Up: At [facility], you and your health needs are our priority.  As part of our continuing mission to provide you with exceptional heart care, we have created designated Provider Care Teams.  These Care Teams include your primary Cardiologist (physician) and Advanced Practice Providers (APPs -  Physician Assistants and Nurse Practitioners) who all work together to provide you with the care you need, when you need it.  We recommend signing up for the patient portal called "MyChart".  Sign up information is provided on this After Visit Summary.  MyChart is used to connect with patients for Virtual Visits (Telemedicine).  Patients are able to view lab/test results, encounter notes, upcoming appointments, etc.  Non-urgent messages can be sent to your provider as well.   To learn more about what you can do with MyChart, go to [email or web address].    Your next appointment:   6 month(s) with Dr.Pemberton on Wednesday, March 1 @ 3:00 pm.   The format for your next appointment:   In Person  Provider:   Laurance Flatten, MD   Other Instructions Please check blood pressure readings X 2 week and either call @ [phone] or send mychart message.

## 2020-12-20 ENCOUNTER — Other Ambulatory Visit: Payer: Medicare Other | Admitting: *Deleted

## 2020-12-20 ENCOUNTER — Other Ambulatory Visit: Payer: Self-pay

## 2020-12-20 DIAGNOSIS — I350 Nonrheumatic aortic (valve) stenosis: Secondary | ICD-10-CM

## 2020-12-20 DIAGNOSIS — I10 Essential (primary) hypertension: Secondary | ICD-10-CM

## 2020-12-20 DIAGNOSIS — N1832 Chronic kidney disease, stage 3b: Secondary | ICD-10-CM

## 2020-12-20 LAB — BASIC METABOLIC PANEL
BUN/Creatinine Ratio: 18 (ref 12–28)
BUN: 27 mg/dL (ref 10–36)
CO2: 22 mmol/L (ref 20–29)
Calcium: 9.3 mg/dL (ref 8.7–10.3)
Chloride: 98 mmol/L (ref 96–106)
Creatinine, Ser: 1.47 mg/dL — ABNORMAL HIGH (ref 0.57–1.00)
Glucose: 102 mg/dL — ABNORMAL HIGH (ref 65–99)
Potassium: 4.2 mmol/L (ref 3.5–5.2)
Sodium: 137 mmol/L (ref 134–144)
eGFR: 33 mL/min/{1.73_m2} — ABNORMAL LOW (ref >59)

## 2020-12-22 ENCOUNTER — Other Ambulatory Visit: Payer: Self-pay | Admitting: *Deleted

## 2020-12-22 DIAGNOSIS — I10 Essential (primary) hypertension: Secondary | ICD-10-CM

## 2020-12-22 DIAGNOSIS — Z79899 Other long term (current) drug therapy: Secondary | ICD-10-CM

## 2020-12-22 MED ORDER — HYDROCHLOROTHIAZIDE 12.5 MG PO CAPS: 12.5000 mg | ORAL_CAPSULE | Freq: Every day | ORAL | 3 refills | Status: AC

## 2020-12-22 NOTE — Telephone Encounter (Signed)
Her recent Creatinine was improved/stable. Please update her med list to reflect that she has resumed HCTZ and stopped the Hydralazine.   PLAN: -Arrange repeat BMET in 2 weeks. Tereso Newcomer, PA-C    12/22/2020 7:58 AM

## 2020-12-22 NOTE — Telephone Encounter (Signed)
S/w pt about changes and lab work.  Pt is taking one tablet by mouth ( 12.5 mg) daily of HCTZ.  Pt D/C hydralazine, medication list updated. Pt is moving to [facility] please review [employer]. Mailed copy of order for pt to get labs drawn to new address.

## 2021-06-26 NOTE — Progress Notes (Deleted)
?Cardiology Office Note:   ? ?Date:  06/26/2021  ? ?IDTayia Hunter, DOB 07-18-1926, MRN [id number] ? ?PCP:  Patient, No Pcp Per (Inactive) ?  ?[facility] Providers ?Cardiologist:  Meriam Sprague, MD ?Cardiology APP:  Beatrice Lecher, PA-C { ?Click to update primary MD,subspecialty MD or APP then REFRESH:1}   ? ?Referring MD: No ref. provider found  ? ?No chief complaint on file. ?*** ? ?History of Present Illness:   ? ?Regina Hunter is a 88 y.o. female with a hx of *** ? ?Past Medical History:  ?Diagnosis Date  ? Cardiomegaly   ? per CXR April 2013  ? Cataract   ? CKD (chronic kidney disease), stage III (HCC)   ? Fractured patella   ? s/p repair by Dr. Shelle Iron  ? HTN (hypertension)   ? Hyperlipidemia   ? Hypertensive heart disease without heart failure   ? LVH (left ventricular hypertrophy)   ? per echo April 2013  ? Murmur   ? ? ?Past Surgical History:  ?Procedure Laterality Date  ? CATARACT EXTRACTION    ? ? ?Current Medications: ?No outpatient medications have been marked as taking for the 06/29/21 encounter (Appointment) with Meriam Sprague, MD.  ?  ? ?Allergies:   Amlodipine, Losartan, and Simvastatin  ? ?Social History  ? ?Socioeconomic History  ? Marital status: Married  ?  Spouse name: Not on file  ? Number of children: Not on file  ? Years of education: Not on file  ? Highest education level: Not on file  ?Occupational History  ? Not on file  ?Tobacco Use  ? Smoking status: Never  ? Smokeless tobacco: Never  ?Vaping Use  ? Vaping Use: Never used  ?Substance and Sexual Activity  ? Alcohol use: No  ? Drug use: No  ? Sexual activity: Not Currently  ?Other Topics Concern  ? Not on file  ?Social History Narrative  ? Not on file  ? ?Social Determinants of Health  ? ?Financial Resource Strain: Not on file  ?Food Insecurity: Not on file  ?Transportation Needs: Not on file  ?Physical Activity: Not on file  ?Stress: Not on file  ?Social Connections: Not on file  ?  ? ?Family History: ?The patient's  ***family history includes Heart failure in her mother. There is no history of Anemia, Arrhythmia, Asthma, Clotting disorder, Fainting, Heart attack, Heart disease, Hyperlipidemia, or Hypertension. ? ?ROS:   ?Please see the history of present illness.    ?*** All other systems reviewed and are negative. ? ?EKGs/Labs/Other Studies Reviewed:   ? ?The following studies were reviewed today: ?*** ? ?EKG:  EKG is *** ordered today.  The ekg ordered today demonstrates *** ? ?Recent Labs: ?12/20/2020: BUN 27; Creatinine, Ser 1.47; Potassium 4.2; Sodium 137  ?Recent Lipid Panel ?   ?Component Value Date/Time  ? CHOL 213 (H) 05/10/2018 0955  ? TRIG 108 05/10/2018 0955  ? HDL 58 05/10/2018 0955  ? CHOLHDL 3.7 05/10/2018 0955  ? CHOLHDL 3.9 05/27/2015 0814  ? VLDL 30 05/27/2015 0814  ? LDLCALC 133 (H) 05/10/2018 0955  ? LDLDIRECT 156.5 05/28/2013 0933  ? ? ? ?Risk Assessment/Calculations:   ?{Does this patient have ATRIAL FIBRILLATION?:[phone]} ? ?    ? ?Physical Exam:   ? ?VS:  There were no vitals taken for this visit.   ? ?Wt Readings from Last 3 Encounters:  ?12/03/20 159 lb 9.6 oz (72.4 kg)  ?08/09/20 160 lb 12.8 oz (72.9 kg)  ?05/21/19 172  lb 12.8 oz (78.4 kg)  ?  ? ?GEN: *** Well nourished, well developed in no acute distress ?HEENT: Normal ?NECK: No JVD; No carotid bruits ?LYMPHATICS: No lymphadenopathy ?CARDIAC: ***RRR, no murmurs, rubs, gallops ?RESPIRATORY:  Clear to auscultation without rales, wheezing or rhonchi  ?ABDOMEN: Soft, non-tender, non-distended ?MUSCULOSKELETAL:  No edema; No deformity  ?SKIN: Warm and dry ?NEUROLOGIC:  Alert and oriented x 3 ?PSYCHIATRIC:  Normal affect  ? ?ASSESSMENT:   ? ?No diagnosis found. ?PLAN:   ? ?In order of problems listed above: ? ?*** ? ?   ? ?{Are you ordering a CV Procedure (e.g. stress test, cath, DCCV, TEE, etc)?   Press F2        :[id number]}  ? ? ?Medication Adjustments/Labs and Tests Ordered: ?Current medicines are reviewed at length with the patient today.  Concerns  regarding medicines are outlined above.  ?No orders of the defined types were placed in this encounter. ? ?No orders of the defined types were placed in this encounter. ? ? ?There are no Patient Instructions on file for this visit.  ? ?Signed, ?Meriam Sprague, MD  ?06/26/2021 8:05 PM    ?[facility] ?

## 2021-06-29 ENCOUNTER — Ambulatory Visit: Payer: Medicare Other | Admitting: Cardiology

## 2022-02-25 ENCOUNTER — Other Ambulatory Visit: Payer: Self-pay | Admitting: Physician Assistant

## 2023-10-30 DEATH — deceased
